# Patient Record
Sex: Female | Born: 1935 | Race: Black or African American | Hispanic: No | Smoking: Never smoker
Health system: Southern US, Community
[De-identification: ages and names within clinical notes are randomized; demographics above are authoritative.]

## PROBLEM LIST (undated history)

## (undated) DIAGNOSIS — R42 Dizziness and giddiness: Secondary | ICD-10-CM

## (undated) DIAGNOSIS — E119 Type 2 diabetes mellitus without complications: Secondary | ICD-10-CM

## (undated) DIAGNOSIS — E669 Obesity, unspecified: Secondary | ICD-10-CM

## (undated) DIAGNOSIS — T7840XA Allergy, unspecified, initial encounter: Secondary | ICD-10-CM

## (undated) DIAGNOSIS — I10 Essential (primary) hypertension: Secondary | ICD-10-CM

## (undated) HISTORY — DX: Dizziness and giddiness: R42

## (undated) HISTORY — PX: ABDOMINAL HYSTERECTOMY: SHX81

## (undated) HISTORY — DX: Type 2 diabetes mellitus without complications: E11.9

## (undated) HISTORY — DX: Allergy, unspecified, initial encounter: T78.40XA

## (undated) HISTORY — PX: TOTAL ABDOMINAL HYSTERECTOMY W/ BILATERAL SALPINGOOPHORECTOMY: SHX83

## (undated) HISTORY — DX: Essential (primary) hypertension: I10

## (undated) HISTORY — DX: Obesity, unspecified: E66.9

---

## 2000-10-22 ENCOUNTER — Encounter: Payer: Self-pay | Admitting: Family Medicine

## 2000-10-22 ENCOUNTER — Ambulatory Visit (HOSPITAL_COMMUNITY): Admission: RE | Admit: 2000-10-22 | Discharge: 2000-10-22 | Payer: Self-pay | Admitting: Family Medicine

## 2001-10-25 ENCOUNTER — Ambulatory Visit (HOSPITAL_COMMUNITY): Admission: RE | Admit: 2001-10-25 | Discharge: 2001-10-25 | Payer: Self-pay | Admitting: Internal Medicine

## 2001-10-25 ENCOUNTER — Encounter: Payer: Self-pay | Admitting: Internal Medicine

## 2003-03-27 ENCOUNTER — Ambulatory Visit (HOSPITAL_COMMUNITY): Admission: RE | Admit: 2003-03-27 | Discharge: 2003-03-27 | Payer: Self-pay | Admitting: Family Medicine

## 2004-01-11 ENCOUNTER — Ambulatory Visit (HOSPITAL_COMMUNITY): Admission: RE | Admit: 2004-01-11 | Discharge: 2004-01-11 | Payer: Self-pay | Admitting: Family Medicine

## 2004-01-15 ENCOUNTER — Ambulatory Visit (HOSPITAL_COMMUNITY): Admission: RE | Admit: 2004-01-15 | Discharge: 2004-01-15 | Payer: Self-pay | Admitting: Family Medicine

## 2004-03-29 ENCOUNTER — Ambulatory Visit (HOSPITAL_COMMUNITY): Admission: RE | Admit: 2004-03-29 | Discharge: 2004-03-29 | Payer: Self-pay | Admitting: Family Medicine

## 2004-05-09 ENCOUNTER — Ambulatory Visit: Payer: Self-pay | Admitting: Family Medicine

## 2004-05-10 ENCOUNTER — Ambulatory Visit (HOSPITAL_COMMUNITY): Admission: RE | Admit: 2004-05-10 | Discharge: 2004-05-10 | Payer: Self-pay | Admitting: Family Medicine

## 2004-05-25 ENCOUNTER — Ambulatory Visit: Payer: Self-pay | Admitting: Family Medicine

## 2004-07-04 ENCOUNTER — Ambulatory Visit: Payer: Self-pay | Admitting: Family Medicine

## 2004-10-04 ENCOUNTER — Ambulatory Visit: Payer: Self-pay | Admitting: Family Medicine

## 2004-10-13 ENCOUNTER — Ambulatory Visit: Payer: Self-pay | Admitting: *Deleted

## 2004-10-28 ENCOUNTER — Ambulatory Visit (HOSPITAL_COMMUNITY): Admission: RE | Admit: 2004-10-28 | Discharge: 2004-10-28 | Payer: Self-pay | Admitting: *Deleted

## 2004-10-28 ENCOUNTER — Ambulatory Visit: Payer: Self-pay | Admitting: Cardiovascular Disease

## 2004-12-02 ENCOUNTER — Ambulatory Visit: Payer: Self-pay | Admitting: *Deleted

## 2005-01-31 ENCOUNTER — Ambulatory Visit: Payer: Self-pay | Admitting: Family Medicine

## 2005-04-04 ENCOUNTER — Ambulatory Visit (HOSPITAL_COMMUNITY): Admission: RE | Admit: 2005-04-04 | Discharge: 2005-04-04 | Payer: Self-pay | Admitting: Family Medicine

## 2005-04-26 ENCOUNTER — Ambulatory Visit (HOSPITAL_COMMUNITY): Admission: RE | Admit: 2005-04-26 | Discharge: 2005-04-26 | Payer: Self-pay | Admitting: Family Medicine

## 2005-06-07 ENCOUNTER — Ambulatory Visit: Payer: Self-pay | Admitting: Family Medicine

## 2005-12-12 ENCOUNTER — Ambulatory Visit: Payer: Self-pay | Admitting: Family Medicine

## 2006-04-30 ENCOUNTER — Ambulatory Visit (HOSPITAL_COMMUNITY): Admission: RE | Admit: 2006-04-30 | Discharge: 2006-04-30 | Payer: Self-pay | Admitting: Family Medicine

## 2006-05-15 ENCOUNTER — Ambulatory Visit: Payer: Self-pay | Admitting: Family Medicine

## 2006-05-15 ENCOUNTER — Other Ambulatory Visit: Admission: RE | Admit: 2006-05-15 | Discharge: 2006-05-15 | Payer: Self-pay | Admitting: Family Medicine

## 2006-05-15 ENCOUNTER — Emergency Department (HOSPITAL_COMMUNITY): Admission: EM | Admit: 2006-05-15 | Discharge: 2006-05-15 | Payer: Self-pay | Admitting: Emergency Medicine

## 2006-09-26 ENCOUNTER — Encounter: Payer: Self-pay | Admitting: Family Medicine

## 2006-09-26 LAB — CONVERTED CEMR LAB
Basophils Absolute: 0 10*3/uL (ref 0.0–0.1)
Basophils Relative: 1 % (ref 0–1)
Calcium: 9.2 mg/dL (ref 8.4–10.5)
Cholesterol: 157 mg/dL (ref 0–200)
Eosinophils Absolute: 0.1 10*3/uL (ref 0.0–0.7)
Glucose, Bld: 91 mg/dL (ref 70–99)
HDL: 48 mg/dL (ref >39)
Lymphs Abs: 1.9 10*3/uL (ref 0.7–3.3)
MCV: 93.6 fL (ref 78.0–100.0)
Neutrophils Relative %: 54 % (ref 43–77)
Platelets: 163 10*3/uL (ref 150–400)
Potassium: 4.6 meq/L (ref 3.5–5.3)
RDW: 13.2 % (ref 11.5–14.0)
Sodium: 141 meq/L (ref 135–145)
Total CHOL/HDL Ratio: 3.3
Triglycerides: 122 mg/dL (ref <150)
VLDL: 24 mg/dL (ref 0–40)
WBC: 5.5 10*3/uL (ref 4.0–10.5)

## 2006-10-16 ENCOUNTER — Ambulatory Visit: Payer: Self-pay | Admitting: Family Medicine

## 2007-02-26 ENCOUNTER — Ambulatory Visit: Payer: Self-pay | Admitting: Family Medicine

## 2007-05-03 ENCOUNTER — Ambulatory Visit (HOSPITAL_COMMUNITY): Admission: RE | Admit: 2007-05-03 | Discharge: 2007-05-03 | Payer: Self-pay | Admitting: Family Medicine

## 2007-06-25 ENCOUNTER — Ambulatory Visit: Payer: Self-pay | Admitting: Family Medicine

## 2007-10-21 ENCOUNTER — Encounter: Payer: Self-pay | Admitting: Family Medicine

## 2007-10-21 DIAGNOSIS — E663 Overweight: Secondary | ICD-10-CM | POA: Insufficient documentation

## 2008-02-04 ENCOUNTER — Ambulatory Visit: Payer: Self-pay | Admitting: Family Medicine

## 2008-02-05 ENCOUNTER — Encounter: Payer: Self-pay | Admitting: Family Medicine

## 2008-02-12 ENCOUNTER — Encounter: Payer: Self-pay | Admitting: Family Medicine

## 2008-02-12 LAB — CONVERTED CEMR LAB
BUN: 23 mg/dL (ref 6–23)
Basophils Relative: 0 % (ref 0–1)
Chloride: 104 meq/L (ref 96–112)
Cholesterol: 159 mg/dL (ref 0–200)
Creatinine, Ser: 0.79 mg/dL (ref 0.40–1.20)
Eosinophils Absolute: 0.1 10*3/uL (ref 0.0–0.7)
Glucose, Bld: 100 mg/dL — ABNORMAL HIGH (ref 70–99)
HCT: 41.3 % (ref 36.0–46.0)
Hemoglobin: 13.6 g/dL (ref 12.0–15.0)
LDL Cholesterol: 75 mg/dL (ref 0–99)
Lymphs Abs: 2 10*3/uL (ref 0.7–4.0)
MCHC: 32.9 g/dL (ref 30.0–36.0)
MCV: 92.2 fL (ref 78.0–100.0)
Monocytes Absolute: 0.6 10*3/uL (ref 0.1–1.0)
Monocytes Relative: 10 % (ref 3–12)
Potassium: 4.2 meq/L (ref 3.5–5.3)
RBC: 4.48 M/uL (ref 3.87–5.11)
Triglycerides: 182 mg/dL — ABNORMAL HIGH (ref <150)
VLDL: 36 mg/dL (ref 0–40)
WBC: 5.7 10*3/uL (ref 4.0–10.5)

## 2008-03-03 ENCOUNTER — Ambulatory Visit: Payer: Self-pay | Admitting: Cardiology

## 2008-03-03 ENCOUNTER — Encounter: Payer: Self-pay | Admitting: Family Medicine

## 2008-03-09 ENCOUNTER — Ambulatory Visit: Payer: Self-pay | Admitting: Family Medicine

## 2008-03-09 DIAGNOSIS — J309 Allergic rhinitis, unspecified: Secondary | ICD-10-CM | POA: Insufficient documentation

## 2008-03-09 DIAGNOSIS — Z9189 Other specified personal risk factors, not elsewhere classified: Secondary | ICD-10-CM | POA: Insufficient documentation

## 2008-05-26 ENCOUNTER — Ambulatory Visit (HOSPITAL_COMMUNITY): Admission: RE | Admit: 2008-05-26 | Discharge: 2008-05-26 | Payer: Self-pay | Admitting: Family Medicine

## 2008-06-09 ENCOUNTER — Ambulatory Visit: Payer: Self-pay | Admitting: Family Medicine

## 2008-06-09 ENCOUNTER — Other Ambulatory Visit: Admission: RE | Admit: 2008-06-09 | Discharge: 2008-06-09 | Payer: Self-pay | Admitting: Family Medicine

## 2008-06-09 ENCOUNTER — Encounter: Payer: Self-pay | Admitting: Family Medicine

## 2008-06-09 LAB — CONVERTED CEMR LAB
Glucose, Bld: 106 mg/dL
Hgb A1c MFr Bld: 6.3 %
OCCULT 1: NEGATIVE

## 2008-09-02 ENCOUNTER — Encounter: Payer: Self-pay | Admitting: Family Medicine

## 2008-09-02 LAB — CONVERTED CEMR LAB
ALT: 27 units/L (ref 0–35)
AST: 25 units/L (ref 0–37)
Albumin: 4 g/dL (ref 3.5–5.2)
Alkaline Phosphatase: 85 units/L (ref 39–117)
BUN: 21 mg/dL (ref 6–23)
Chloride: 106 meq/L (ref 96–112)
Cholesterol: 154 mg/dL (ref 0–200)
Creatinine, Ser: 0.78 mg/dL (ref 0.40–1.20)
HDL: 51 mg/dL (ref >39)
Indirect Bilirubin: 0.4 mg/dL (ref 0.0–0.9)
Total Protein: 6.7 g/dL (ref 6.0–8.3)
Triglycerides: 112 mg/dL (ref <150)

## 2008-09-08 ENCOUNTER — Ambulatory Visit: Payer: Self-pay | Admitting: Family Medicine

## 2008-09-30 ENCOUNTER — Encounter: Payer: Self-pay | Admitting: Family Medicine

## 2008-09-30 ENCOUNTER — Ambulatory Visit (HOSPITAL_COMMUNITY): Admission: RE | Admit: 2008-09-30 | Discharge: 2008-09-30 | Payer: Self-pay | Admitting: Pediatrics

## 2008-12-09 ENCOUNTER — Ambulatory Visit: Payer: Self-pay | Admitting: Family Medicine

## 2008-12-17 ENCOUNTER — Encounter: Payer: Self-pay | Admitting: Family Medicine

## 2009-03-09 LAB — CONVERTED CEMR LAB
Basophils Absolute: 0 10*3/uL (ref 0.0–0.1)
Chloride: 103 meq/L (ref 96–112)
Creatinine, Urine: 107.6 mg/dL
Glucose, Bld: 110 mg/dL — ABNORMAL HIGH (ref 70–99)
HCT: 41.1 % (ref 36.0–46.0)
Hemoglobin: 13.4 g/dL (ref 12.0–15.0)
Lymphocytes Relative: 35 % (ref 12–46)
Lymphs Abs: 1.9 10*3/uL (ref 0.7–4.0)
Microalb Creat Ratio: 4.6 mg/g (ref 0.0–30.0)
Monocytes Absolute: 0.5 10*3/uL (ref 0.1–1.0)
Monocytes Relative: 10 % (ref 3–12)
Neutro Abs: 3 10*3/uL (ref 1.7–7.7)
Potassium: 4.2 meq/L (ref 3.5–5.3)
RBC: 4.4 M/uL (ref 3.87–5.11)
RDW: 13.5 % (ref 11.5–15.5)
Sodium: 142 meq/L (ref 135–145)
TSH: 1.898 microintl units/mL (ref 0.350–4.500)
WBC: 5.5 10*3/uL (ref 4.0–10.5)

## 2009-03-16 ENCOUNTER — Ambulatory Visit: Payer: Self-pay | Admitting: Family Medicine

## 2009-04-08 ENCOUNTER — Telehealth: Payer: Self-pay | Admitting: Family Medicine

## 2009-04-13 ENCOUNTER — Ambulatory Visit: Payer: Self-pay | Admitting: Family Medicine

## 2009-05-28 ENCOUNTER — Encounter: Payer: Self-pay | Admitting: Family Medicine

## 2009-07-09 ENCOUNTER — Ambulatory Visit (HOSPITAL_COMMUNITY): Admission: RE | Admit: 2009-07-09 | Discharge: 2009-07-09 | Payer: Self-pay | Admitting: Family Medicine

## 2009-07-12 ENCOUNTER — Encounter: Payer: Self-pay | Admitting: Family Medicine

## 2009-07-22 ENCOUNTER — Ambulatory Visit: Payer: Self-pay | Admitting: Family Medicine

## 2009-07-22 LAB — CONVERTED CEMR LAB
Blood Glucose, Fasting: 95 mg/dL
Hgb A1c MFr Bld: 5.9 % (ref 4.6–6.1)

## 2009-11-15 LAB — CONVERTED CEMR LAB
Calcium: 9.1 mg/dL (ref 8.4–10.5)
Chloride: 105 meq/L (ref 96–112)
Creatinine, Ser: 0.75 mg/dL (ref 0.40–1.20)
HDL: 49 mg/dL (ref >39)
Sodium: 141 meq/L (ref 135–145)
Triglycerides: 103 mg/dL (ref <150)

## 2009-11-22 ENCOUNTER — Ambulatory Visit: Payer: Self-pay | Admitting: Family Medicine

## 2009-11-22 DIAGNOSIS — R5383 Other fatigue: Secondary | ICD-10-CM

## 2009-11-22 DIAGNOSIS — R7303 Prediabetes: Secondary | ICD-10-CM

## 2009-11-22 DIAGNOSIS — R5381 Other malaise: Secondary | ICD-10-CM | POA: Insufficient documentation

## 2009-11-24 ENCOUNTER — Telehealth: Payer: Self-pay | Admitting: Family Medicine

## 2010-03-30 ENCOUNTER — Encounter: Payer: Self-pay | Admitting: Orthopedic Surgery

## 2010-03-30 ENCOUNTER — Telehealth: Payer: Self-pay | Admitting: Family Medicine

## 2010-03-30 ENCOUNTER — Ambulatory Visit (HOSPITAL_COMMUNITY): Admission: RE | Admit: 2010-03-30 | Discharge: 2010-03-30 | Payer: Self-pay | Admitting: Family Medicine

## 2010-03-30 LAB — CONVERTED CEMR LAB
Basophils Relative: 0 % (ref 0–1)
Eosinophils Absolute: 0.1 10*3/uL (ref 0.0–0.7)
Eosinophils Relative: 1 % (ref 0–5)
Hemoglobin: 13.8 g/dL (ref 12.0–15.0)
MCHC: 33.8 g/dL (ref 30.0–36.0)
MCV: 92.1 fL (ref 78.0–100.0)
Monocytes Absolute: 0.5 10*3/uL (ref 0.1–1.0)
Monocytes Relative: 8 % (ref 3–12)
RBC: 4.43 M/uL (ref 3.87–5.11)

## 2010-03-31 ENCOUNTER — Telehealth (INDEPENDENT_AMBULATORY_CARE_PROVIDER_SITE_OTHER): Payer: Self-pay | Admitting: *Deleted

## 2010-04-07 ENCOUNTER — Ambulatory Visit: Payer: Self-pay | Admitting: Family Medicine

## 2010-04-18 ENCOUNTER — Encounter: Payer: Self-pay | Admitting: Family Medicine

## 2010-04-21 ENCOUNTER — Ambulatory Visit: Payer: Self-pay | Admitting: Family Medicine

## 2010-04-21 ENCOUNTER — Telehealth: Payer: Self-pay | Admitting: Family Medicine

## 2010-05-10 ENCOUNTER — Encounter: Payer: Self-pay | Admitting: Family Medicine

## 2010-05-10 DIAGNOSIS — I10 Essential (primary) hypertension: Secondary | ICD-10-CM | POA: Insufficient documentation

## 2010-05-31 NOTE — Progress Notes (Signed)
Summary: results from x ray  Phone Note Call from Patient   Summary of Call: call her cell # @ [phone] when you get results from x ray Initial call taken by: Lind Guest,  March 30, 2010 10:54 AM  Follow-up for Phone Call        returned call, no answer Follow-up by: Adella Hare LPN,  March 30, 2010 5:57 PM

## 2010-05-31 NOTE — Progress Notes (Signed)
Summary: returning nurses call  Phone Note Call from Patient   Summary of Call: pt called back and said call her cell phone [phone] Initial call taken by: Rudene Anda,  November 24, 2009 8:08 AM  Follow-up for Phone Call        advised bloodwork normal Follow-up by: Adella Hare LPN,  November 24, 2009 9:02 AM

## 2010-05-31 NOTE — Miscellaneous (Signed)
Summary: med  Clinical Lists Changes  Medications: Rx of LOSARTAN POTASSIUM-HCTZ 50-12.5 MG TABS (LOSARTAN POTASSIUM-HCTZ) Take 1 tablet by mouth once a day;  #90 x 4;  Signed;  Entered by: Worthy Keeler LPN;  Authorized by: Syliva Overman MD;  Method used: Historical    Prescriptions: LOSARTAN POTASSIUM-HCTZ 50-12.5 MG TABS (LOSARTAN POTASSIUM-HCTZ) Take 1 tablet by mouth once a day  #90 x 4   Entered by:   Worthy Keeler LPN   Authorized by:   Syliva Overman MD   Signed by:   Worthy Keeler LPN on 04/54/0981   Method used:   Historical   RxID:   [id number]

## 2010-05-31 NOTE — Progress Notes (Signed)
Summary: returning call  Phone Note Call from Patient   Summary of Call: patient called stated that someone called her yesterday..patient said to call her on her cell # [phone] Initial call taken by: Eugenio Hoes,  March 31, 2010 8:12 AM  Follow-up for Phone Call        patient called in and said someone called her yesterday.  I looked and saw that you tried calling her about her imagings, I advised the patient that no fractures were seen, and patient was a little upset states that she has paperwork she needs filled out and will bring them in next week.  She said that she didn't think the insurance was going to pay since there was no fracture.  She also stated it was hard for her to lift her arm.  I told her I would put a phone note in.  Please advise. Follow-up by: Curtis Sites,  March 31, 2010 9:38 AM  Additional Follow-up for Phone Call Additional follow up Details #1::        I will adress her concerns at next visit, pls let her know Additional Follow-up by: Syliva Overman MD,  March 31, 2010 5:16 PM    Additional Follow-up for Phone Call Additional follow up Details #2::    left msg for patient to return my call. Follow-up by: Curtis Sites,  April 01, 2010 10:13 AM  Additional Follow-up for Phone Call Additional follow up Details #3:: Details for Additional Follow-up Action Taken: called patient, no answer Adella Hare LPN,  April 01, 2010 4:04 PM  Patient called back and I gave her the message that her concerns would be addressed at her next office meeting, she understood. Additional Follow-up by: Curtis Sites,  April 04, 2010 1:58 PM

## 2010-05-31 NOTE — Progress Notes (Signed)
Summary: eye exam  eye exam   Imported By: Lind Guest 07/23/2009 13:58:39  _____________________________________________________________________  External Attachment:    Type:   Image     Comment:   External Document

## 2010-05-31 NOTE — Assessment & Plan Note (Signed)
Summary: office visit   Vital Signs:  Patient profile:   75 year old female Menstrual status:  hysterectomy Height:      67 inches Weight:      198.50 pounds BMI:     31.20 O2 Sat:      95 % Pulse rate:   65 / minute Pulse rhythm:   regular Resp:     16 per minute BP sitting:   118 / 68  (left arm) Cuff size:   large  Vitals Entered By: Everitt Amber LPN (July 22, 2009 7:59 AM)  Nutrition Counseling: Patient's BMI is greater than 25 and therefore counseled on weight management options. CC: Follow up chronic problems   CC:  Follow up chronic problems.  History of Present Illness: Reports  that she has been  doing well. Denies recent fever or chills. Denies sinus pressure, nasal congestion , ear pain or sore throat. Denies chest congestion, or cough productive of sputum. Denies chest pain, palpitations, PND, orthopnea or leg swelling. Denies abdominal pain, nausea, vomitting, diarrhea or constipation. Denies change in bowel movements or bloody stool. Denies dysuria , frequency, incontinence or hesitancy. Denies  joint pain, swelling, or reduced mobility. Denies headaches, vertigo, seizures. Denies depression, anxiety or insomnia. Denies  rash, lesions, or itch. She continues to exercise at least 4 days per week and has also changed her diet.She has been very successful with weight loss.     Current Medications (verified): 1)  Anacin 81 Mg  Tbec (Aspirin) .... One Tab By Mouth Once Daily 2)  Centrum Silver   Tabs (Multiple Vitamins-Minerals) .... One Tab By Mouth Once Daily 3)  Calcium 600/vitamin D 600-400 Mg-Unit Chew (Calcium Carbonate-Vitamin D) .... Take 1 Tablet By Mouth Two Times A Day 4)  Losartan Potassium-Hctz 50-12.5 Mg Tabs (Losartan Potassium-Hctz) .... Take 1 Tablet By Mouth Once A Day  Allergies (verified): 1)  ! * Lotensin Hct  Review of Systems      See HPI Eyes:  Denies blurring and discharge. Derm:  Denies itching, lesion(s), and rash. Endo:   Denies cold intolerance, excessive hunger, excessive thirst, excessive urination, heat intolerance, polyuria, and weight change. Heme:  not testing regulalrly. Allergy:  Denies hives or rash and itching eyes.  Physical Exam  General:  Well-developed,well-nourished,in no acute distress; alert,appropriate and cooperative throughout examination HEENT: No facial asymmetry,  EOMI, No sinus tenderness, TM's Clear, oropharynx  pink and moist.   Chest: Clear to auscultation bilaterally.  CVS: S1, S2, No murmurs, No S3.   Abd: Soft, Nontender.  MS: Adequate ROM spine, hips, shoulders and knees.  Ext: No edema.   CNS: CN 2-12 intact, power tone and sensation normal throughout.   Skin: Intact, no visible lesions or rashes.  Psych: Good eye contact, normal affect.  Memory intact, not anxious or depressed appearing.    Impression & Recommendations:  Problem # 1:  ALLERGIC RHINITIS CAUSE UNSPECIFIED (ICD-477.9) Assessment Deteriorated  Her updated medication list for this problem includes:    Zyrtec Hives Relief 10 Mg Tabs (Cetirizine hcl) .Marland Kitchen... Take 1 tablet by mouth once a day as needed  Problem # 2:  OBESITY, MILD (ICD-278.02) Assessment: Improved  Ht: 67 (07/22/2009)   Wt: 198.50 (07/22/2009)   BMI: 31.20 (07/22/2009)  Problem # 3:  DIABETES MELLITUS, TYPE II (ICD-250.00) Assessment: Improved  Her updated medication list for this problem includes:    Anacin 81 Mg Tbec (Aspirin) ..... One tab by mouth once daily    Losartan Potassium-hctz 50-12.5  Mg Tabs (Losartan potassium-hctz) .Marland Kitchen... Take 1 tablet by mouth once a day  Orders: Glucose, (CBG) (82962) T- Hemoglobin A1C ([id number])  Labs Reviewed: Creat: 0.78 (03/09/2009)     Last Eye Exam: normal (08/06/2008) Reviewed HgBA1c results: 5.8 (03/16/2009)  6.1 (12/09/2008)  Problem # 4:  HYPERTENSION (ICD-401.9) Assessment: Unchanged  Her updated medication list for this problem includes:    Losartan Potassium-hctz 50-12.5 Mg  Tabs (Losartan potassium-hctz) .Marland Kitchen... Take 1 tablet by mouth once a day  Orders: T-Basic Metabolic Panel ([id number])  BP today: 118/68 Prior BP: 120/70 (04/13/2009)  Labs Reviewed: K+: 4.2 (03/09/2009) Creat: : 0.78 (03/09/2009)   Chol: 154 (09/02/2008)   HDL: 51 (09/02/2008)   LDL: 81 (09/02/2008)   TG: 112 (09/02/2008)  Complete Medication List: 1)  Anacin 81 Mg Tbec (Aspirin) .... One tab by mouth once daily 2)  Centrum Silver Tabs (Multiple vitamins-minerals) .... One tab by mouth once daily 3)  Calcium 600/vitamin D 600-400 Mg-unit Chew (Calcium carbonate-vitamin d) .... Take 1 tablet by mouth two times a day 4)  Losartan Potassium-hctz 50-12.5 Mg Tabs (Losartan potassium-hctz) .... Take 1 tablet by mouth once a day 5)  Zyrtec Hives Relief 10 Mg Tabs (Cetirizine hcl) .... Take 1 tablet by mouth once a day as needed  Other Orders: T-Lipid Profile ([id number])  Patient Instructions: 1)  CPE in 4 months. 2)  Congrats on your healthy lifestyle wth weight loss, pls leep it up. 3)  Lab today and fasting lipid and chem 7 in 4 months. Prescriptions: ZYRTEC HIVES RELIEF 10 MG TABS (CETIRIZINE HCL) Take 1 tablet by mouth once a day as needed  #30 x 2   Entered and Authorized by:   Syliva Overman MD   Signed by:   Syliva Overman MD on 07/22/2009   Method used:   Electronically to        [facility] (retail)       [address]       [place], [place]  [address]       Ph: [phone]       Fax: [phone]   RxID:   [id number]   Laboratory Results   Blood Tests     Glucose (fasting): 95 mg/dL   (Normal Range: 15-176)
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
## 2010-06-02 NOTE — Progress Notes (Signed)
Summary: LEG PROBLEMS  Phone Note Call from Patient   Summary of Call: PATIENT CAMES IN STATES THAT SHE IS HAVING PROBLEMS OUT OF HER RIGHT LEG ADVISE PATIENT DID NOT HAVE ANYTHING TODAY... SHE WANTED TO KNOW COULD THE DOCTOR CALL SOME MEDICATION FOR HER PLEASE CALL @ [phone] Initial call taken by: Eugenio Hoes,  April 21, 2010 12:58 PM  Follow-up for Phone Call        pls let her know med is sent to her pharmacy Follow-up by: Syliva Overman MD,  April 21, 2010 1:53 PM  Additional Follow-up for Phone Call Additional follow up Details #1::        patient aware Additional Follow-up by: Adella Hare LPN,  April 21, 2010 2:22 PM    New/Updated Medications: IBUPROFEN 600 MG TABS (IBUPROFEN) Take 1 tablet by mouth three times a day for 5 days , then once daily as needed for pain Prescriptions: IBUPROFEN 600 MG TABS (IBUPROFEN) Take 1 tablet by mouth three times a day for 5 days , then once daily as needed for pain  #20 x 0   Entered and Authorized by:   Syliva Overman MD   Signed by:   Syliva Overman MD on 04/21/2010   Method used:   Electronically to        [facility] (retail)       [address]       [place], [place]  [address]       Ph: [phone]       Fax: [phone]   RxID:   [id number]

## 2010-06-02 NOTE — Assessment & Plan Note (Signed)
Summary: INS CARD   Allergies: 1)  ! * Lotensin Hct   Complete Medication List: 1)  Anacin 81 Mg Tbec (Aspirin) .... One tab by mouth once daily 2)  Centrum Silver Tabs (Multiple vitamins-minerals) .... One tab by mouth once daily 3)  Calcium 600/vitamin D 600-400 Mg-unit Chew (Calcium carbonate-vitamin d) .... Take 1 tablet by mouth two times a day 4)  Losartan Potassium-hctz 50-12.5 Mg Tabs (Losartan potassium-hctz) .... Take 1 tablet by mouth once a day 5)  Zyrtec Hives Relief 10 Mg Tabs (Cetirizine hcl) .... Take 1 tablet by mouth once a day as needed  Other Orders: Form Completion ([address])   Orders Added: 1)  Form Completion [[id number]]

## 2010-06-02 NOTE — Miscellaneous (Signed)
Summary: correction on billing for form completion for accidental fall   Clinical Lists Changes  Problems: Added new problem of ESSENTIAL HYPERTENSION (ICD-401.9) Orders: Added new Service order of Form Completion [phone]) - Signed

## 2010-06-02 NOTE — Letter (Signed)
Summary: ACCIDENT PAPER  ACCIDENT PAPER   Imported By: Lind Guest 04/18/2010 10:10:53  _____________________________________________________________________  External Attachment:    Type:   Image     Comment:   External Document

## 2010-06-02 NOTE — Assessment & Plan Note (Signed)
Summary: office visit   Vital Signs:  Patient profile:   75 year old female Menstrual status:  hysterectomy Height:      67 inches Weight:      204.25 pounds BMI:     32.11 O2 Sat:      98 % on Room air Pulse rate:   67 / minute Pulse rhythm:   regular Resp:     16 per minute BP sitting:   130 / 76  (left arm)  Vitals Entered By: Adella Hare LPN (April 07, 2010 10:29 AM)  Nutrition Counseling: Patient's BMI is greater than 25 and therefore counseled on weight management options.  O2 Flow:  Room air CC: follow-up visit Is Patient Diabetic? No Pain Assessment Patient in pain? no        CC:  follow-up visit.  History of Present Illness: [facility], she was volunteering to make soup, think it was 03/11/2010. Had pain over right arm and right leg, xrays are neg, still hurts but not as severe. she has not been exercising as before she has been eating more sweets and gaine d weight, fortunately her hBA1c  is better Reports  that she has otherwise been doing well. Denies recent fever or chills. Denies sinus pressure, nasal congestion , ear pain or sore throat. Denies chest congestion, or cough productive of sputum. Denies chest pain, palpitations, PND, orthopnea or leg swelling. Denies abdominal pain, nausea, vomitting, diarrhea or constipation. Denies change in bowel movements or bloody stool. Denies dysuria , frequency, incontinence or hesitancy.  Denies headaches, vertigo, seizures. Denies depression, anxiety or insomnia. Denies  rash, lesions, or itch.     Current Medications (verified): 1)  Anacin 81 Mg  Tbec (Aspirin) .... One Tab By Mouth Once Daily 2)  Centrum Silver   Tabs (Multiple Vitamins-Minerals) .... One Tab By Mouth Once Daily 3)  Calcium 600/vitamin D 600-400 Mg-Unit Chew (Calcium Carbonate-Vitamin D) .... Take 1 Tablet By Mouth Two Times A Day 4)  Losartan Potassium-Hctz 50-12.5 Mg Tabs (Losartan  Potassium-Hctz) .... Take 1 Tablet By Mouth Once A Day 5)  Zyrtec Hives Relief 10 Mg Tabs (Cetirizine Hcl) .... Take 1 Tablet By Mouth Once A Day As Needed  Allergies (verified): 1)  ! * Lotensin Hct  Social History: Reviewed history from 10/21/2007 and no changes required. Retired Widow 0 children Never Smoked Alcohol use-no Drug use-no  Review of Systems      See HPI Eyes:  Denies blurring and discharge. MS:  Complains of joint pain; right arm and leg pai improved since recen trauma. Endo:  Denies cold intolerance, excessive hunger, excessive thirst, and excessive urination. Heme:  Denies abnormal bruising and bleeding. Allergy:  Denies hives or rash and itching eyes.  Physical Exam  General:  Well-developed,well-nourished,in no acute distress; alert,appropriate and cooperative throughout examination HEENT: No facial asymmetry,  EOMI, No sinus tenderness, TM's Clear, oropharynx  pink and moist.   Chest: Clear to auscultation bilaterally.  CVS: S1, S2, No murmurs, No S3.   Abd: Soft, Nontender.  MS: Adequate ROM spine, hips, shoulders and knees. mild tenderness on palpation of right arm and leg, no swelling Ext: No edema.   CNS: CN 2-12 intact, power tone and sensation normal throughout.   Skin: Intact, no visible lesions or rashes.No bruises, or skin breakdown Psych: Good eye contact, normal affect.  Memory intact, not anxious or depressed appearing.   Diabetes Management Exam:  Eye Exam:       Eye Exam done elsewhere          Date: 02/11/2010          Results: normal          Done by: dr patty   Impression & Recommendations:  Problem # 1:  HYPERTENSION (ICD-401.9) Assessment Improved  Her updated medication list for this problem includes:    Losartan Potassium-hctz 50-12.5 Mg Tabs (Losartan potassium-hctz) .Marland Kitchen... Take 1 tablet by mouth once a day  BP today: 130/76 Prior BP: 138/76 (11/22/2009)  Labs Reviewed: K+: 4.0 (11/15/2009) Creat: : 0.75  (11/15/2009)   Chol: 173 (11/15/2009)   HDL: 49 (11/15/2009)   LDL: 103 (11/15/2009)   TG: 103 (11/15/2009)  Problem # 2:  IMPAIRED FASTING GLUCOSE (ICD-790.21) Assessment: Improved hBA1c improved  Problem # 3:  OBESITY, MILD (ICD-278.02) Assessment: Comment Only  Ht: 67 (04/07/2010)   Wt: 204.25 (04/07/2010)   BMI: 32.11 (04/07/2010) therapeutic lifestyle change discussed and encouraged  Problem # 4:  ACCIDENTAL FALL FROM OTHER FURNITURE (ICD-E884.5) Assessment: Improved improved symptoms of arm and leg pain following tripon rug  Complete Medication List: 1)  Anacin 81 Mg Tbec (Aspirin) .... One tab by mouth once daily 2)  Centrum Silver Tabs (Multiple vitamins-minerals) .... One tab by mouth once daily 3)  Calcium 600/vitamin D 600-400 Mg-unit Chew (Calcium carbonate-vitamin d) .... Take 1 tablet by mouth two times a day 4)  Losartan Potassium-hctz 50-12.5 Mg Tabs (Losartan potassium-hctz) .... Take 1 tablet by mouth once a day 5)  Zyrtec Hives Relief 10 Mg Tabs (Cetirizine hcl) .... Take 1 tablet by mouth once a day as needed  Other Orders: T- Hemoglobin A1C ([id number])  Patient Instructions: 1)  CPE in 4 months. 2)  It is important that you exercise regularly at least 30 minutes 5 times a week. If you develop chest pain, have severe difficulty breathing, or feel very tired , stop exercising immediately and seek medical attention. 3)  You need to lose weight. Consider a lower calorie diet and regular exercise. Goal is 5 pounds 4)  HbgA1C prior to visit, ICD-9 5)  no med changes at this time. 6)  Happy christmas.:   Orders Added: 1)  Est. Patient Level IV [99214] 2)  T- Hemoglobin A1C [[id number]]   Immunization History:  Influenza Immunization History:    Influenza:  historical (01/29/2009)    Influenza:  historical (01/29/2010) 01/29/09 entered in error  Immunization History:  Influenza Immunization History:    Influenza:  Historical (01/29/2009)     Influenza:  Historical (01/29/2010)

## 2010-06-09 ENCOUNTER — Ambulatory Visit (INDEPENDENT_AMBULATORY_CARE_PROVIDER_SITE_OTHER): Payer: Medicare Other | Admitting: Family Medicine

## 2010-06-09 ENCOUNTER — Encounter: Payer: Self-pay | Admitting: Family Medicine

## 2010-06-09 DIAGNOSIS — M79609 Pain in unspecified limb: Secondary | ICD-10-CM | POA: Insufficient documentation

## 2010-06-09 DIAGNOSIS — E663 Overweight: Secondary | ICD-10-CM

## 2010-06-16 NOTE — Assessment & Plan Note (Signed)
Summary: office visit   Vital Signs:  Patient profile:   75 year old female Menstrual status:  hysterectomy Height:      67 inches Weight:      209.25 pounds BMI:     32.89 O2 Sat:      96 % Pulse rate:   78 / minute Resp:     16 per minute BP sitting:   130 / 70  (left arm) Cuff size:   large  Vitals Entered By: Everitt Amber LPN (June 09, 2010 9:49 AM) CC: Follow up, she fell the 12th of Nov and had xrays done but she is still having pain in her right lower leg. Doesn't hurt at night but starts hurting in the mornings when she gets up Pain Assessment Patient in pain? yes     Location: right leg Intensity: 8 Type: aching Onset of pain  after fall in Nov, not getting better   CC:  Follow up and she fell the 12th of Nov and had xrays done but she is still having pain in her right lower leg. Doesn't hurt at night but starts hurting in the mornings when she gets up.  History of Present Illness: c/o pain in right leg radiating up to right buttock evere since a fall Nov 12. Pt states no pain at night, but weight bearing or walking causes pain from an 8 to a 10. She still also is c/o right arm pain.  Reports  that she is otherwise doing well. she is concerned about continued weight gain , espescially becus of her inability to exercise Denies recent fever or chills. Denies sinus pressure, nasal congestion , ear pain or sore throat. Denies chest congestion, or cough productive of sputum. Denies chest pain, palpitations, PND, orthopnea or leg swelling. Denies abdominal pain, nausea, vomitting, diarrhea or constipation. Denies change in bowel movements or bloody stool. Denies dysuria , frequency, incontinence or hesitancy.  Denies headaches, vertigo, seizures. Denies depression, anxiety or insomnia. Denies  rash, lesions, or itch.      Current Medications (verified): 1)  Anacin 81 Mg  Tbec (Aspirin) .... One Tab By Mouth Once Daily 2)  Centrum Silver   Tabs (Multiple  Vitamins-Minerals) .... One Tab By Mouth Once Daily 3)  Calcium 600/vitamin D 600-400 Mg-Unit Chew (Calcium Carbonate-Vitamin D) .... Take 1 Tablet By Mouth Two Times A Day 4)  Losartan Potassium-Hctz 50-12.5 Mg Tabs (Losartan Potassium-Hctz) .... Take 1 Tablet By Mouth Once A Day 5)  Zyrtec Hives Relief 10 Mg Tabs (Cetirizine Hcl) .... Take 1 Tablet By Mouth Once A Day As Needed 6)  Ibuprofen 600 Mg Tabs (Ibuprofen) .... Take 1 Tablet By Mouth Three Times A Day For 5 Days , Then Once Daily As Needed For Pain  Allergies (verified): 1)  ! * Lotensin Hct  Review of Systems      See HPI Eyes:  Complains of vision loss-both eyes. MS:  Complains of joint pain, low back pain, mid back pain, muscle weakness, and stiffness. Endo:  Complains of weight change; denies cold intolerance, excessive hunger, excessive thirst, and excessive urination. Heme:  Denies abnormal bruising, bleeding, enlarge lymph nodes, and fevers. Allergy:  Denies hives or rash, itching eyes, persistent infections, and seasonal allergies.  Physical Exam  General:  Well-developed,well-nourished,in no acute distress; alert,appropriate and cooperative throughout examination HEENT: No facial asymmetry,  EOMI, No sinus tenderness, TM's Clear, oropharynx  pink and moist.   Chest: Clear to auscultation bilaterally.  CVS: S1, S2, No  murmurs, No S3.   Abd: Soft, Nontender.  MS: decreased  ROM spine and right  hip,adequate in  shoulder and knees. mild tenderness on palpation of right arm and leg, no swelling Ext: No edema.   CNS: CN 2-12 intact, power tone and sensation normal throughout.   Skin: Intact, no visible lesions or rashes.No bruises, or skin breakdown Psych: Good eye contact, normal affect.  Memory intact, not anxious or depressed appearing.    Impression & Recommendations:  Problem # 1:  ARM PAIN, RIGHT (ICD-729.5) Assessment Unchanged  Orders: Orthopedic Referral (Ortho) Admin of Therapeutic Inj  intramuscular  or subcutaneous (11914)  Problem # 2:  LEG PAIN, RIGHT (ICD-729.5) Assessment: Unchanged  Orders: Medicare Electronic Prescription (N8295) Depo- Medrol 80mg  (J1040) Orthopedic Referral (Ortho) Ketorolac-Toradol 15mg  (A2130)  Problem # 3:  ESSENTIAL HYPERTENSION (ICD-401.9) Assessment: Unchanged  Her updated medication list for this problem includes:    Losartan Potassium-hctz 50-12.5 Mg Tabs (Losartan potassium-hctz) .Marland Kitchen... Take 1 tablet by mouth once a day  BP today: 130/70 Prior BP: 130/76 (04/07/2010)  Labs Reviewed: K+: 4.0 (11/15/2009) Creat: : 0.75 (11/15/2009)   Chol: 173 (11/15/2009)   HDL: 49 (11/15/2009)   LDL: 103 (11/15/2009)   TG: 103 (11/15/2009)  Problem # 4:  FATIGUE (ICD-780.79) Assessment: Deteriorated weight gain and inactivity are the main contributors  Problem # 5:  OBESITY, MILD (ICD-278.02) Assessment: Deteriorated  Ht: 67 (06/09/2010)   Wt: 209.25 (06/09/2010)   BMI: 32.89 (06/09/2010) therapeutic lifestyle change discussed and encouraged  Complete Medication List: 1)  Anacin 81 Mg Tbec (Aspirin) .... One tab by mouth once daily 2)  Centrum Silver Tabs (Multiple vitamins-minerals) .... One tab by mouth once daily 3)  Calcium 600/vitamin D 600-400 Mg-unit Chew (Calcium carbonate-vitamin d) .... Take 1 tablet by mouth two times a day 4)  Losartan Potassium-hctz 50-12.5 Mg Tabs (Losartan potassium-hctz) .... Take 1 tablet by mouth once a day 5)  Zyrtec Hives Relief 10 Mg Tabs (Cetirizine hcl) .... Take 1 tablet by mouth once a day as needed 6)  Prednisone (pak) 5 Mg Tabs (Prednisone) .... Use as directed 7)  Ibuprofen 800 Mg Tabs (Ibuprofen) .... Take 1 tablet by mouth three times a day  Patient Instructions: 1)  F/U as before. 2)  You wil get injections today and meds are also sent in. 3)  You are being referred to orthopedics as soon as possible re your lower extremity and upper arm pain. Prescriptions: IBUPROFEN 800 MG TABS (IBUPROFEN) Take 1  tablet by mouth three times a day  #30 x 0   Entered and Authorized by:   Syliva Overman MD   Signed by:   Syliva Overman MD on 06/09/2010   Method used:   Electronically to        [facility], [facility] (retail)       [address]       [place], [place]  [address]       Ph: [phone]       Fax: [phone]   RxID:   [id number] PREDNISONE (PAK) 5 MG TABS (PREDNISONE) Use as directed  #21 x 0   Entered and Authorized by:   Syliva Overman MD   Signed by:   Syliva Overman MD on 06/09/2010   Method used:   Electronically to        [facility], [facility] (retail)       [address]  [place], [place]  [address]       Ph: [phone]       Fax: [phone]   RxID:   [id number]    Medication Administration  Injection # 1:    Medication: Depo- Medrol 80mg     Diagnosis: LEG PAIN, RIGHT (ICD-729.5)    Route: IM    Site: RUOQ gluteus    Exp Date: 07/12    Lot #: Gunnar [id number]    Mfr: Pharmacia    Patient tolerated injection without complications    Given by: Adella Hare LPN (June 09, 2010 10:25 AM)  Injection # 2:    Medication: Ketorolac-Toradol 15mg     Diagnosis: LEG PAIN, RIGHT (ICD-729.5)    Route: IM    Site: LUOQ gluteus    Exp Date: 09/30/2011    Lot #: [id number]    Mfr: [email or web address]    Comments: toradol 60mg  given    Patient tolerated injection without complications    Given by: Adella Hare LPN (June 09, 2010 10:25 AM)  Orders Added: 1)  Est. Patient Level IV [59563] 2)  Medicare Electronic Prescription [G8553] 3)  Depo- Medrol 80mg  [J1040] 4)  Orthopedic Referral [Ortho] 5)  Ketorolac-Toradol 15mg  [J1885] 6)  Admin of Therapeutic Inj  intramuscular or subcutaneous [96372]     Medication Administration  Injection # 1:    Medication: Depo- Medrol 80mg     Diagnosis: LEG PAIN, RIGHT (ICD-729.5)    Route: IM    Site: RUOQ gluteus    Exp Date: 07/12    Lot #: Gunnar [id number]    Mfr: Pharmacia     Patient tolerated injection without complications    Given by: Adella Hare LPN (June 09, 2010 10:25 AM)  Injection # 2:    Medication: Ketorolac-Toradol 15mg     Diagnosis: LEG PAIN, RIGHT (ICD-729.5)    Route: IM    Site: LUOQ gluteus    Exp Date: 09/30/2011    Lot #: [id number]    Mfr: [email or web address]    Comments: toradol 60mg  given    Patient tolerated injection without complications    Given by: Adella Hare LPN (June 09, 2010 10:25 AM)  Orders Added: 1)  Est. Patient Level IV [29518] 2)  Medicare Electronic Prescription [G8553] 3)  Depo- Medrol 80mg  [J1040] 4)  Orthopedic Referral [Ortho] 5)  Ketorolac-Toradol 15mg  [J1885] 6)  Admin of Therapeutic Inj  intramuscular or subcutaneous [84166]

## 2010-07-08 ENCOUNTER — Other Ambulatory Visit: Payer: Self-pay | Admitting: Family Medicine

## 2010-07-08 DIAGNOSIS — Z139 Encounter for screening, unspecified: Secondary | ICD-10-CM

## 2010-07-14 ENCOUNTER — Ambulatory Visit (INDEPENDENT_AMBULATORY_CARE_PROVIDER_SITE_OTHER): Payer: Medicare Other | Admitting: Orthopedic Surgery

## 2010-07-14 ENCOUNTER — Encounter: Payer: Self-pay | Admitting: Orthopedic Surgery

## 2010-07-14 DIAGNOSIS — G5780 Other specified mononeuropathies of unspecified lower limb: Secondary | ICD-10-CM

## 2010-07-19 NOTE — Assessment & Plan Note (Signed)
Summary: RT LEG,"MID-PART,BACK OF LEG"/XR's [employer]/MEDICARE,BCBS/CAF   Visit Type:  new patient Referring Provider:  Dr. Syliva Overman  CC:  right leg pain.  History of Present Illness: I saw Erika [name] in the office today for an initial visit.  She is a 75 years old woman with the complaint of:  right leg pain  Medications: Calcium 600 mg, Aspirin 81 mg, Multivitamin, Losartan/HETZ.  The patient fell several weeks ago, and since that time has had a pain on the outer portion of her RIGHT leg on the lateral aspect of the L5 and peroneal nerve district. Pain is 8/10. She describes it as burning, it is also  associated with numbness and bad weather.   Family history of heart disease, arthritis, diabetes.  Social history she is widowed retired. Does not smoke or drink caffeine discrete complete. It was 12     Allergies: 1)  ! * Lotensin Hct  Review of Systems Constitutional:  Complains of weight gain; denies weight loss, fever, chills, and fatigue. Cardiovascular:  Denies chest pain, palpitations, fainting, and murmurs. Respiratory:  Denies short of breath, wheezing, couch, tightness, pain on inspiration, and snoring . Gastrointestinal:  Denies heartburn, nausea, vomiting, diarrhea, constipation, and blood in your stools. Genitourinary:  Complains of bleeding in urine; denies frequency, urgency, difficulty urinating, painful urination, and flank pain. Neurologic:  Complains of dizziness; denies numbness, tingling, unsteady gait, tremors, and seizure. Musculoskeletal:  Complains of muscle pain; denies joint pain, swelling, instability, stiffness, redness, and heat. Endocrine:  Denies excessive thirst, exessive urination, and heat or cold intolerance. Psychiatric:  Denies nervousness, depression, anxiety, and hallucinations. Skin:  Denies changes in the skin, poor healing, rash, itching, and redness. HEENT:  Denies blurred or double vision, eye pain, redness, and  watering. Immunology:  Denies seasonal allergies, sinus problems, and allergic to bee stings. Hemoatologic:  Complains of easy bleeding; denies brusing.  Physical Exam  Msk:  normal grooming hygiene. medium - large for him.   Height 5 7-1/2, weight 208 normal resting pulse of 76.   Pulses:  normal RIGHT lower extremity dorsalis pedis, posterior tibial pulses, normal color. No peripheral edema Extremities:  normal strength in the RIGHT foot  Soft tissue tenderness along the lateral compartment. Neurologic:  tenderness around the peroneal nerve at the fibula tenderness down the RIGHT leg as well. Skin:  intact without lesions or rashes Psych:  alert and cooperative; normal mood and affect; normal attention span and concentration   Impression & Recommendations:  Problem # 1:  OTHER MONONEURITIS OF LOWER LIMB (ICD-355.79) Assessment Comment Only The x-rays were done at [facility]. The report and the films have been reviewed. RIGHT tib-fib x-ray report, negative  peroneal nerve neuropathy, post traumatic.  Recommend Neurontin for 4-8 weeks.  Orders: New Patient Level II ([address])  Medications Added to Medication List This Visit: 1)  Neurontin 100 Mg Caps (Gabapentin) .Marland Kitchen.. 1 tablet three times a day  or all 3 tabs at night  Patient Instructions: 1)  take medication until it runs out  2)  no follow up needed  Prescriptions: NEURONTIN 100 MG CAPS (GABAPENTIN) 1 tablet three times a day  or all 3 tabs at night  #60 x 1   Entered and Authorized by:   Fuller Canada MD   Signed by:   Fuller Canada MD on 07/14/2010   Method used:   Print then Give to Patient   RxID:   [id number]    Orders Added: 1)  New Patient Level II [95621]

## 2010-07-21 ENCOUNTER — Other Ambulatory Visit: Payer: Self-pay | Admitting: Family Medicine

## 2010-07-22 ENCOUNTER — Ambulatory Visit (HOSPITAL_COMMUNITY): Payer: Medicare Other

## 2010-07-25 ENCOUNTER — Other Ambulatory Visit: Payer: Self-pay | Admitting: Family Medicine

## 2010-07-25 ENCOUNTER — Ambulatory Visit (HOSPITAL_COMMUNITY)
Admission: RE | Admit: 2010-07-25 | Discharge: 2010-07-25 | Disposition: A | Discharge status: Home / Self Care | Payer: Medicare Other | Source: Ambulatory Visit | Attending: Family Medicine | Admitting: Family Medicine

## 2010-07-25 DIAGNOSIS — Z1231 Encounter for screening mammogram for malignant neoplasm of breast: Secondary | ICD-10-CM | POA: Insufficient documentation

## 2010-07-25 DIAGNOSIS — Z139 Encounter for screening, unspecified: Secondary | ICD-10-CM

## 2010-07-26 LAB — HEMOGLOBIN A1C
Hgb A1c MFr Bld: 5.9 % — ABNORMAL HIGH (ref <5.7)
Mean Plasma Glucose: 123 mg/dL — ABNORMAL HIGH (ref <117)

## 2010-07-28 NOTE — Letter (Signed)
Summary: History form  History form   Imported By: Cammie Sickle 07/18/2010 21:29:15  _____________________________________________________________________  External Attachment:    Type:   Image     Comment:   External Document

## 2010-08-01 ENCOUNTER — Encounter: Payer: Self-pay | Admitting: Family Medicine

## 2010-08-03 ENCOUNTER — Encounter: Payer: Self-pay | Admitting: Family Medicine

## 2010-08-03 ENCOUNTER — Ambulatory Visit (INDEPENDENT_AMBULATORY_CARE_PROVIDER_SITE_OTHER): Payer: Medicare Other | Admitting: Family Medicine

## 2010-08-03 ENCOUNTER — Other Ambulatory Visit (HOSPITAL_COMMUNITY)
Admission: RE | Admit: 2010-08-03 | Discharge: 2010-08-03 | Disposition: A | Discharge status: Home / Self Care | Payer: Medicare Other | Source: Ambulatory Visit | Attending: Family Medicine | Admitting: Family Medicine

## 2010-08-03 VITALS — BP 140/80 | HR 68 | Resp 16 | Ht 68.0 in | Wt 207.1 lb

## 2010-08-03 DIAGNOSIS — Z13220 Encounter for screening for lipoid disorders: Secondary | ICD-10-CM

## 2010-08-03 DIAGNOSIS — I10 Essential (primary) hypertension: Secondary | ICD-10-CM

## 2010-08-03 DIAGNOSIS — Z124 Encounter for screening for malignant neoplasm of cervix: Secondary | ICD-10-CM

## 2010-08-03 DIAGNOSIS — Z1211 Encounter for screening for malignant neoplasm of colon: Secondary | ICD-10-CM

## 2010-08-03 DIAGNOSIS — Z0000 Encounter for general adult medical examination without abnormal findings: Secondary | ICD-10-CM

## 2010-08-03 DIAGNOSIS — R7301 Impaired fasting glucose: Secondary | ICD-10-CM

## 2010-08-03 DIAGNOSIS — E669 Obesity, unspecified: Secondary | ICD-10-CM

## 2010-08-03 MED ORDER — LOSARTAN POTASSIUM-HCTZ 50-12.5 MG PO TABS: 1.0000 | ORAL_TABLET | Freq: Every day | ORAL | Status: DC

## 2010-08-03 NOTE — Patient Instructions (Addendum)
F/U after November 18, 2010  Fasting lipid, chem 7, HBA1C July 18 or after.  pls cut back on salt, your blood pressure is high at 140/80.  A healthy diet is rich in fruit, vegetables and whole grains. Poultry fish, nuts and beans are a healthy choice for protein rather then red meat. A low sodium diet and drinking 64 ounces of water daily is generally recommended. Oils and sweet should be limited. Carbohydrates especially for those who are diabetic or overweight, should be limited to 34-45 gram per meal. It is important to eat on a regular schedule, at least 3 times daily. Snacks should be primarily fruits, vegetables or nuts.  You will be referred for a colonoscopy to Dr. Kendell Bane It is important that you exercise regularly at least 30 minutes 5 times a week. If you develop chest pain, have severe difficulty breathing, or feel very tired, stop exercising immediately and seek medical attention

## 2010-08-08 ENCOUNTER — Encounter: Payer: Self-pay | Admitting: *Deleted

## 2010-08-08 ENCOUNTER — Encounter: Payer: Self-pay | Admitting: Family Medicine

## 2010-08-08 NOTE — Progress Notes (Signed)
  Subjective:    Patient ID: Erika [name], female    DOB: 1935/12/01, 75 y.o.   MRN: [id number]  The PT is here for annual exam and re-evaluation of chronic medical conditions, medication management and review of recent lab and radiology data.  Preventive health is updated, specifically  Cancer screening, Osteoporosis screening and Immunization.   Questions or concerns regarding consultations or procedures which the PT has had in the interim are  addressed. The PT denies any adverse reactions to current medications since the last visit.  There are no new concerns.  There are no specific complaints   Hypertension This is a chronic problem. The current episode started more than 1 year ago. The problem has been gradually worsening since onset. The problem is uncontrolled. Pertinent negatives include no anxiety, blurred vision, chest pain, headaches, orthopnea, palpitations, peripheral edema, PND or shortness of breath. There are no associated agents to hypertension. Risk factors for coronary artery disease include diabetes mellitus, obesity and post-menopausal state. Past treatments include angiotensin blockers and diuretics. The current treatment provides moderate improvement. There are no compliance problems.    Review of Systems  Eyes: Negative for blurred vision.  Respiratory: Negative for shortness of breath.   Cardiovascular: Negative for chest pain, palpitations, orthopnea and PND.  Neurological: Negative for headaches.   Denies recent fever or chills. Denies sinus pressure, nasal congestion, ear pain or sore throat. Denies chest congestion, productive cough or wheezing. Denies chest pains, palpitations, paroxysmal nocturnal dyspnea, orthopnea and leg swelling Denies abdominal pain, nausea, vomiting,diarrhea or constipation.  Denies rectal bleeding or change in bowel movement. Denies dysuria, frequency, hesitancy or incontinence. Denies joint pain, swelling and limitation and  mobility.chronic back pain. Denies headaches, seizure, numbness, or tingling. Denies depression, anxiety or insomnia. Denies skin break down or rash.         Objective:   Physical Exam Pleasant well nourished female, alert and oriented x 3, in no cardio-pulmonary distress. Afebrile. HEENT No facial trauma or asymetry.   EOMI, PERTL, fundoscopic exam is negative for hemorhages or exudates. External ears normal, tympanic membranes clear. Oropharynx moist, no exudate, good dentition. Neck: supple, no adenopathy,JVD or thyromegaly.No bruits.  Chest: Clear to ascultation bilaterally.No crackles or wheezes. Non tender to palpation  Breast: No asymetry,no masses. No nipple discharge or inversion. No axillary or supraclavicular adenopathy  Cardiovascular system; Heart sounds normal,  S1 and  S2 ,no S3.  No murmur, or thrill. Apical beat not displaced Peripheral pulses normal.no edema.  Abdomen: Soft, non tender, no organomegaly or masses. No bruits. Bowel sounds normal. No guarding, tenderness or rebound.  Rectal:  No mass. guaic negative stool.  GU: External genitalia normal. No lesions. Vaginal canal normal.No discharge. Uterus absent, no adnexal masses, no  adnexal tenderness.  Musculoskeletal exam: Decreased  ROM of spine,adequate in  hips , shoulders and knees. No deformity ,swelling or crepitus noted. No muscle wasting or atrophy.   Neurologic: Cranial nerves 2 to 12 intact. Power, tone ,sensation and reflexes normal throughout. No disturbance in gait. No tremor.  Skin: Intact, no ulceration, erythema , scaling or rash noted. Pigmentation normal throughout  Psych; Normal mood and affect. Judgement and concentration normal Memory intact.        Assessment & Plan:  1. Hypertension; uncontrolled, no med change at this time. Increased activity and DASH diet discussed 2. Obesity: improved 3.h/o diabetes: will check hBA1C

## 2010-08-16 ENCOUNTER — Telehealth: Payer: Self-pay

## 2010-08-16 DIAGNOSIS — Z139 Encounter for screening, unspecified: Secondary | ICD-10-CM

## 2010-08-16 NOTE — Telephone Encounter (Signed)
LMOM for pt to call to schedule colonoscopy.  

## 2010-08-17 NOTE — Telephone Encounter (Signed)
OK for colonoscopy.  

## 2010-08-17 NOTE — Telephone Encounter (Signed)
Gastroenterology Pre-Procedure Form  Request Date: 08/15/2010    Requesting Physician: Dr. Lodema Hong     PATIENT INFORMATION:  Erika [name] is a 75 y.o., female (DOB=[birth year]).  PROCEDURE: Procedure(s) requested: colonoscopy Procedure Reason: screening for colon cancer  PATIENT REVIEW QUESTIONS: The patient reports the following:   1. Diabetes Melitis: Yes    (Diet Controlled) 2. Joint replacements in the past 12 months: no 3. Major health problems in the past 3 months: no 4. Has an artificial valve or MVP:no 5. Has been advised in past to take antibiotics in advance of a procedure like teeth cleaning: no}    MEDICATIONS & ALLERGIES:    Patient reports the following regarding taking any blood thinners:   Plavix? no Aspirin? Yes   81 Coumadin?  no  Patient confirms/reports the following medications:  Current Outpatient Prescriptions  Medication Sig Dispense Refill  . aspirin (ANACIN) 81 MG EC tablet Take 81 mg by mouth daily. Take one tablet by mouth daily         . b complex vitamins tablet Take 1 tablet by mouth daily.        . Calcium Carbonate-Vitamin D (CALCIUM 600/VITAMIN D) 600-400 MG-UNIT per chew tablet Chew 1 tablet by mouth daily. Take one tablet by mouth two times a day       . cetirizine (ZYRTEC HIVES RELIEF) 10 MG tablet Take 10 mg by mouth daily.        Marland Kitchen losartan-hydrochlorothiazide (HYZAAR) 50-12.5 MG per tablet Take 1 tablet by mouth daily.  90 tablet  0  . Multiple Vitamins-Minerals (CENTRUM SILVER) tablet Take 1 tablet by mouth daily. One tablet by mouth once daily         Patient confirms/reports the following allergies:  Allergies  Allergen Reactions  . Benazepril-Hydrochlorothiazide     REACTION: cough  . Gabapentin Itching    Patient is appropriate to schedule for requested procedure(s): yes  AUTHORIZATION INFORMATION Primary Insurance:Medicare     ID #: [id number]  Pre-Cert / Berkley Harvey required: Pre-Cert / Auth #:  Secondary Insurance: [place]     ID #: [id number]    Pre-Cert / Auth required Pre-Cert / Auth #:  Orders Placed This Encounter  Procedures  . Endoscopy, colon, diagnostic    Standing Status: Future     Number of Occurrences:      Standing Expiration Date: 08/17/2011    Order Specific Question:  Pre-op diagnosis    Answer:  Screening colonoscopy    Order Specific Question:  Pre-op visit required?    Answer:  No [0]    SCHEDULE INFORMATION: Procedure has been scheduled as follows:  Date: 09/22/2010    Time: 10:30 AM  Location: [facility] Short Stay  This Gastroenterology Pre-Precedure Form is being routed to the following provider(s) for review: Lorenza Burton

## 2010-08-22 ENCOUNTER — Encounter: Payer: Self-pay | Admitting: Family Medicine

## 2010-09-05 ENCOUNTER — Telehealth: Payer: Self-pay

## 2010-09-05 NOTE — Telephone Encounter (Signed)
Per Crystal pt has been moved from 10:30 am on 09/22/2010 to 11:15 Am and will need to be at Short Stay at 10:15 am to register. LMOM for pt the full change, and told her to call if she has questions.

## 2010-09-06 NOTE — Telephone Encounter (Signed)
Pt is aware of the time change. She will check with ride and will call back to let me know if that is OK.

## 2010-09-08 NOTE — Telephone Encounter (Signed)
Called and left message for pt to call and let me know if she has gotten the ride worked out for day of procedure or do we need to reschedule?

## 2010-09-12 NOTE — Telephone Encounter (Signed)
Called and left message for pt to call and let me know if she has worked out a ride for the day of her procedure.

## 2010-09-13 NOTE — Letter (Signed)
March 03, 2008    Erika [name]. Erika Hong, MD  [address], [address]  [place], [place] [address]   RE:  Erika [name], [name]  MRN:  [id number]  /  DOB:  03-12-1936   Dear Erika [name]:   It was my pleasure evaluating Erika [name] in the office today at your  request for an abnormal EKG.  As you know, this nice woman has enjoyed  generally good health.  She does have mild hypertension that has been  well controlled with medical therapy.  She previously suffered from  diabetes, but this condition reversed when she lost a significant amount  of weight, which peaked at 300 pounds, but subsequently decreased to  approximately 200 pounds.  She was seen by my previous partner, Dr.  Dorethea Clan, in 2006 and underwent a stress nuclear study that was entirely  negative.  The left ventricular systolic function was normal.   Past medical history is otherwise only notable for endometriosis that  required TAH-BSO in 1986.   She has no allergies.   Her only pharmacologic agents are Benicar/hydrochlorothiazide 20/12.5 mg  daily, aspirin, and Caltrate.   Social history, family history, and review of systems were updated.  There were no notable changes.  Erika [name] continues to be quite  active, walking 2 miles per day.  She limits her caloric intake, but  weight loss has stalled in recent years.   PHYSICAL EXAMINATION:  GENERAL:  A very pleasant, well-appearing woman.  VITAL SIGNS:  The weight is 208, 6 pounds more than in 2006.  Blood  pressure 115/65, heart rate 65 and regular, respirations 14.  NECK:  No jugular venous distention; normal carotid upstrokes without  bruits.  HEENT:  Anicteric sclerae; normal lids and conjunctivae; normal oral  mucosa.  ENDOCRINE:  No thyromegaly.  HEMATOPOIETIC:  No adenopathy.  SKIN:  No significant lesions.  LUNGS:  Clear.  CARDIAC:  Normal first and second heart sounds; PMI not palpable.  ABDOMEN:  Soft and nontender; no masses; no organomegaly.  EXTREMITIES:   Normal distal pulses; no edema.  NEUROLOGIC:  Symmetric strength and tone; normal cranial nerves.   EKG:  Your tracing obtained last month is reviewed.  Sinus rhythm is  present with borderline left atrial abnormality and low voltage in the V  leads.  The computer reading suggests nonspecific ST-segment  abnormality, but the ST-segment looks fairly normal to me.  There is no  change when compared to an EKG obtained in 2006.   Additional laboratory was kindly provided by your office.  A recent CBC  was normal except for minimally depressed platelets.  Chemistry profile  was normal.  Lipid profile was quite good.   IMPRESSION:  Erika [name] is doing well overall.  Cardiovascular risk  factors are optimally controlled.  She has no symptoms to suggest  myocardial ischemia.  Her EKG abnormalities are not more oppressive than  one would expect in a woman her age.  I have reassured her that  everything looks good from my point of view and will be happy to see her  again at any time you deem appropriate.    Sincerely,      Erika Friends. Dietrich Pates, MD, [facility]  Electronically Signed    RMR/MedQ  DD: 03/03/2008  DT: 03/04/2008  Job #: [id number]

## 2010-09-15 NOTE — Telephone Encounter (Signed)
LMOM to call.

## 2010-09-16 NOTE — Procedures (Signed)
NAME:  GARLENE, APPERSON NO.:  [id number]   MEDICAL RECORD NO.:  [id number]          PATIENT TYPE:  REC   LOCATION:                                FACILITY:  [facility]   PHYSICIAN:  Charlton Haws, M.D.     DATE OF BIRTH:  [birth year]   DATE OF PROCEDURE:  DATE OF DISCHARGE:                                    STRESS TEST   EXERCISE MYOVIEW:  Ms. Suliman is a 75 year old female with no known coronary disease. Cardiac  risk factors include diabetes and hypertension.  She has no cardiopulmonary  symptoms.   BASELINE DATA:  Electrocardiogram reveals a sinus rhythm at 59 beats per  minute with nonspecific ST abnormalities. Blood pressure is 122/78.   The patient exercised for a total of 7 minutes and 4 seconds to Bruce  protocol stage III and 9.3 mets. Maximum heart rate achieved was 145 beats  per minute, which is 94% of predicted maximum. Maximum blood pressure was  150/88 and resolved down to 128/78 in recovery. EKG revealed no ischemic  changes.  A few PVCs and PVCs were noted; however, no significant  arrhythmias.   Myoview was injected 1 minute prior to cessation of exercise.   Final images and results are pending M.D. review.      Amy B   AB/MEDQ  D:  10/28/2004  T:  10/28/2004  Job:  [id number]

## 2010-09-16 NOTE — Telephone Encounter (Signed)
Pt returned call, her ride has been confirmed for the day of procedure.

## 2010-09-22 ENCOUNTER — Ambulatory Visit (HOSPITAL_COMMUNITY)
Admission: RE | Admit: 2010-09-22 | Discharge: 2010-09-22 | Disposition: A | Discharge status: Home / Self Care | Payer: Medicare Other | Source: Ambulatory Visit | Attending: Internal Medicine | Admitting: Internal Medicine

## 2010-09-22 ENCOUNTER — Encounter: Payer: Medicare Other | Admitting: Internal Medicine

## 2010-09-22 DIAGNOSIS — Z1211 Encounter for screening for malignant neoplasm of colon: Secondary | ICD-10-CM | POA: Insufficient documentation

## 2010-09-22 DIAGNOSIS — Z7982 Long term (current) use of aspirin: Secondary | ICD-10-CM | POA: Insufficient documentation

## 2010-09-22 DIAGNOSIS — I10 Essential (primary) hypertension: Secondary | ICD-10-CM | POA: Insufficient documentation

## 2010-09-22 DIAGNOSIS — Z8371 Family history of colonic polyps: Secondary | ICD-10-CM

## 2010-09-22 DIAGNOSIS — Z79899 Other long term (current) drug therapy: Secondary | ICD-10-CM | POA: Insufficient documentation

## 2010-09-23 LAB — GLUCOSE, CAPILLARY: Glucose-Capillary: 112 mg/dL — ABNORMAL HIGH (ref 70–99)

## 2010-09-23 NOTE — Op Note (Signed)
  NAME:  Erika [name], Erika [name] NO.:  [id number]  MEDICAL RECORD NO.:  [id number]           PATIENT TYPE:  O  LOCATION:  [facility]                          FACILITY:  [facility]  PHYSICIAN:  R. Roetta Sessions, M.D. DATE OF BIRTH:  [birth year]  DATE OF PROCEDURE:  09/22/2010 DATE OF DISCHARGE:                              OPERATIVE REPORT   PROCEDURE:  Colonoscopy screening.  INDICATIONS FOR PROCEDURE:  A 75 year old lady who comes for screening colonoscopy, had a negative flexible sigmoidoscopy back in 2011.  No GI symptoms at this time.  No family history of polyps, colon cancer aside from a sister that may have had a polyp taken out her recently in her 20s.  Colonoscopy is now being done.  Risks, benefits, limitations, alternatives, imponderables have been reviewed, questions answered. Please see the documentation in the medical record.  PROCEDURE NOTE:  O2 saturation, blood pressure, pulse, respirations were monitored throughout the entire procedure.  CONSCIOUS SEDATION:  Versed 3 mg IV, Demerol 50 mg IV in divided doses.  INSTRUMENT:  Pentax video chip system.  FINDINGS:  Digital rectal exam revealed no abnormalities.  Endoscopic findings:  Prep was adequate.  Colon:  Colonic mucosa was surveyed from the rectosigmoid junction through the left transverse right colon to the appendiceal orifice, ileocecal valve/cecum.  These structures were well seen and photographed for the record.  From this level, scope was slowly and cautiously withdrawn.  All previously mentioned mucosal surfaces were again seen.  The colonic mucosa appeared normal.  Scope was pulled down into the rectum where a thorough examination of rectal mucosa including retroflexed view of the anal verge demonstrated no abnormalities.  The patient tolerated the procedure well.  Cecal withdrawal time 8 minutes.  IMPRESSION: 1. Normal colon. 2. Normal rectum.  RECOMMENDATIONS:  Repeat screening colonoscopy in 10  years.     Jonathon Bellows, M.D.     RMR/MEDQ  D:  09/22/2010  T:  09/23/2010  Job:  [id number]  cc:   Milus Mallick. Lodema Hong, M.D. Fax: [phone]  Electronically Signed by Lorrin Goodell M.D. on 09/23/2010 01:49:43 PM

## 2010-11-18 LAB — BASIC METABOLIC PANEL
CO2: 30 mEq/L (ref 19–32)
Calcium: 9.7 mg/dL (ref 8.4–10.5)
Chloride: 101 mEq/L (ref 96–112)
Creat: 0.7 mg/dL (ref 0.50–1.10)
Sodium: 141 mEq/L (ref 135–145)

## 2010-11-18 LAB — HEMOGLOBIN A1C: Hgb A1c MFr Bld: 6.1 % — ABNORMAL HIGH (ref <5.7)

## 2010-11-18 LAB — LIPID PANEL
HDL: 48 mg/dL (ref >39)
Total CHOL/HDL Ratio: 3.6 Ratio
Triglycerides: 150 mg/dL — ABNORMAL HIGH (ref <150)

## 2010-11-23 ENCOUNTER — Encounter: Payer: Self-pay | Admitting: Family Medicine

## 2010-11-23 ENCOUNTER — Ambulatory Visit (INDEPENDENT_AMBULATORY_CARE_PROVIDER_SITE_OTHER): Payer: Medicare Other | Admitting: Family Medicine

## 2010-11-23 VITALS — BP 112/70 | HR 77 | Resp 16 | Ht 67.5 in | Wt 204.1 lb

## 2010-11-23 DIAGNOSIS — R7301 Impaired fasting glucose: Secondary | ICD-10-CM

## 2010-11-23 DIAGNOSIS — J309 Allergic rhinitis, unspecified: Secondary | ICD-10-CM

## 2010-11-23 DIAGNOSIS — I10 Essential (primary) hypertension: Secondary | ICD-10-CM

## 2010-11-23 MED ORDER — LOSARTAN POTASSIUM-HCTZ 50-12.5 MG PO TABS: 1.0000 | ORAL_TABLET | Freq: Every day | ORAL | Status: DC

## 2010-11-23 NOTE — Patient Instructions (Addendum)
F/u in 3 monthas.  You absolutely need to start walking 1 hour daily (three 20 minute sessions)  YOU NEED TO START EATING THE CORRECT WAY FOR A DIABETIC THOUGH YOU ARE NOT YET DIABETIC  HBA1C in 3 month, non fasting  I expect GREAT things from you

## 2010-12-04 NOTE — Assessment & Plan Note (Signed)
Controlled, no change in medication  

## 2010-12-04 NOTE — Progress Notes (Signed)
  Subjective:    Patient ID: Erika [name], female    DOB: [birth year], 75 y.o.   MRN: [id number]  HPI The PT is here for follow up and re-evaluation of chronic medical conditions, medication management and review of any available recent lab and radiology data.  Preventive health is updated, specifically  Cancer screening and Immunization.   Questions or concerns regarding consultations or procedures which the PT has had in the interim are  addressed. The PT denies any adverse reactions to current medications since the last visit.  There are no new concerns.  There are no specific complaints       Review of Systems Denies recent fever or chills. Denies sinus pressure, nasal congestion, ear pain or sore throat. Denies chest congestion, productive cough or wheezing. Denies chest pains, palpitations and leg swelling Denies abdominal pain, nausea, vomiting,diarrhea or constipation.   Denies dysuria, frequency, hesitancy or incontinence. Denies joint pain, swelling and limitation in mobility. Denies headaches, seizures, numbness, or tingling. Denies depression, anxiety or insomnia. Denies skin break down or rash.        Objective:   Physical Exam Patient alert and oriented and in no cardiopulmonary distress.  HEENT: No facial asymmetry, EOMI, no sinus tenderness,  oropharynx pink and moist.  Neck supple no adenopathy.  Chest: Clear to auscultation bilaterally.  CVS: S1, S2 no murmurs, no S3.  ABD: Soft non tender. Bowel sounds normal.  Ext: No edema  MS: Adequate ROM spine, shoulders, hips and knees.  Skin: Intact, no ulcerations or rash noted.  Psych: Good eye contact, normal affect. Memory intact not anxious or depressed appearing.  CNS: CN 2-12 intact, power, tone and sensation normal throughout.        Assessment & Plan:

## 2010-12-04 NOTE — Assessment & Plan Note (Signed)
Deteriorated, HBA1C has increased, pt has a past h/o diabetes on insulin, needs tpo b aggressive and proactive about blood sugar control

## 2010-12-04 NOTE — Assessment & Plan Note (Signed)
Uncontrolled, incrased symptoms, will prescribe med

## 2011-01-06 ENCOUNTER — Telehealth: Payer: Self-pay | Admitting: Family Medicine

## 2011-01-06 MED ORDER — LOSARTAN POTASSIUM-HCTZ 50-12.5 MG PO TABS: 1.0000 | ORAL_TABLET | Freq: Every day | ORAL | Status: DC

## 2011-01-06 NOTE — Telephone Encounter (Signed)
Was sent on 7/25. Advised we sent it in again

## 2011-02-23 LAB — HEMOGLOBIN A1C: Hgb A1c MFr Bld: 5.6 % (ref <5.7)

## 2011-02-24 ENCOUNTER — Encounter: Payer: Self-pay | Admitting: Family Medicine

## 2011-03-01 ENCOUNTER — Encounter: Payer: Self-pay | Admitting: Family Medicine

## 2011-03-01 ENCOUNTER — Ambulatory Visit (INDEPENDENT_AMBULATORY_CARE_PROVIDER_SITE_OTHER): Payer: Medicare Other | Admitting: Family Medicine

## 2011-03-01 VITALS — BP 110/70 | HR 59 | Resp 16 | Ht 67.5 in | Wt 198.8 lb

## 2011-03-01 DIAGNOSIS — I10 Essential (primary) hypertension: Secondary | ICD-10-CM

## 2011-03-01 DIAGNOSIS — R7309 Other abnormal glucose: Secondary | ICD-10-CM

## 2011-03-01 DIAGNOSIS — R7301 Impaired fasting glucose: Secondary | ICD-10-CM

## 2011-03-01 DIAGNOSIS — E669 Obesity, unspecified: Secondary | ICD-10-CM

## 2011-03-01 DIAGNOSIS — R7303 Prediabetes: Secondary | ICD-10-CM

## 2011-03-01 DIAGNOSIS — Z23 Encounter for immunization: Secondary | ICD-10-CM

## 2011-03-01 DIAGNOSIS — E663 Overweight: Secondary | ICD-10-CM

## 2011-03-01 DIAGNOSIS — R5381 Other malaise: Secondary | ICD-10-CM

## 2011-03-01 NOTE — Assessment & Plan Note (Signed)
Improved, also blood sugars are normalized

## 2011-03-01 NOTE — Assessment & Plan Note (Signed)
Controlled, no change in medication  

## 2011-03-01 NOTE — Assessment & Plan Note (Signed)
Improved. Pt applauded on succesful weight loss through lifestyle change, and encouraged to continue same. Weight loss goal set for the next several months.  

## 2011-03-01 NOTE — Progress Notes (Signed)
  Subjective:    Patient ID: Erika [name], female    DOB: 11/04/1935, 75 y.o.   MRN: [id number]  HPI The PT is here for follow up and re-evaluation of chronic medical conditions, medication management and review of any available recent lab and radiology data.  Preventive health is updated, specifically  Cancer screening and Immunization.   Questions or concerns regarding consultations or procedures which the PT has had in the interim are  addressed. The PT denies any adverse reactions to current medications since the last visit.  There are no new concerns.  There are no specific complaints       Review of Systems Denies recent fever or chills. Denies sinus pressure, nasal congestion, ear pain or sore throat. Denies chest congestion, productive cough or wheezing. Denies chest pains, palpitations and leg swelling Denies abdominal pain, nausea, vomiting,diarrhea or constipation.   Denies dysuria, frequency, hesitancy or incontinence. Denies joint pain, swelling and limitation in mobility. Denies headaches, seizures, numbness, or tingling. Denies depression, anxiety or insomnia. Denies skin break down or rash.        Objective:   Physical Exam Patient alert and oriented and in no cardiopulmonary distress.  HEENT: No facial asymmetry, EOMI, no sinus tenderness,  oropharynx pink and moist.  Neck supple no adenopathy.  Chest: Clear to auscultation bilaterally.  CVS: S1, S2 no murmurs, no S3.  ABD: Soft non tender. Bowel sounds normal.  Ext: No edema  MS: Adequate ROM spine, shoulders, hips and knees.  Skin: Intact, no ulcerations or rash noted.  Psych: Good eye contact, normal affect. Memory intact not anxious or depressed appearing.  CNS: CN 2-12 intact, power, tone and sensation normal throughout.        Assessment & Plan:   

## 2011-03-01 NOTE — Patient Instructions (Addendum)
F/u mid March.  Congrats on lifestyle change, 6 pound weight loss, and normal blood sugars. Keep it up!  It is important that you exercise regularly at least 30 minutes 7 times a week. If you develop chest pain, have severe difficulty breathing, or feel very tired, stop exercising immediately and seek medical attention  A healthy diet is rich in fruit, vegetables and whole grains. Poultry fish, nuts and beans are a healthy choice for protein rather then red meat. A low sodium diet and drinking 64 ounces of water daily is generally recommended. Oils and sweet should be limited. Carbohydrates especially for those who are diabetic or overweight, should be limited to 30-45 gram per meal. It is important to eat on a regular schedule, at least 3 times daily. Snacks should be primarily fruits, vegetables or nuts.  Weight loss goal is 8 pounds.   Fasting chem 7 and hBA1C just before next visit, cBC and tSH   tdaP today

## 2011-06-09 ENCOUNTER — Other Ambulatory Visit: Payer: Self-pay

## 2011-06-09 MED ORDER — LOSARTAN POTASSIUM-HCTZ 50-12.5 MG PO TABS: 1.0000 | ORAL_TABLET | Freq: Every day | ORAL | Status: DC

## 2011-07-10 DIAGNOSIS — R5383 Other fatigue: Secondary | ICD-10-CM | POA: Diagnosis not present

## 2011-07-10 DIAGNOSIS — R7309 Other abnormal glucose: Secondary | ICD-10-CM | POA: Diagnosis not present

## 2011-07-10 DIAGNOSIS — I10 Essential (primary) hypertension: Secondary | ICD-10-CM | POA: Diagnosis not present

## 2011-07-11 LAB — CBC WITH DIFFERENTIAL/PLATELET
Hemoglobin: 13.5 g/dL (ref 12.0–15.0)
Lymphocytes Relative: 33 % (ref 12–46)
Lymphs Abs: 1.8 10*3/uL (ref 0.7–4.0)
MCH: 31.1 pg (ref 26.0–34.0)
Monocytes Relative: 10 % (ref 3–12)
Neutro Abs: 3 10*3/uL (ref 1.7–7.7)
Neutrophils Relative %: 55 % (ref 43–77)
Platelets: 138 10*3/uL — ABNORMAL LOW (ref 150–400)
RBC: 4.34 MIL/uL (ref 3.87–5.11)
WBC: 5.4 10*3/uL (ref 4.0–10.5)

## 2011-07-11 LAB — HEMOGLOBIN A1C
Hgb A1c MFr Bld: 5.5 % (ref <5.7)
Mean Plasma Glucose: 111 mg/dL (ref <117)

## 2011-07-11 LAB — BASIC METABOLIC PANEL
Calcium: 9.8 mg/dL (ref 8.4–10.5)
Glucose, Bld: 93 mg/dL (ref 70–99)
Potassium: 4.5 mEq/L (ref 3.5–5.3)
Sodium: 143 mEq/L (ref 135–145)

## 2011-07-11 LAB — TSH: TSH: 3.359 u[IU]/mL (ref 0.350–4.500)

## 2011-07-19 ENCOUNTER — Ambulatory Visit (INDEPENDENT_AMBULATORY_CARE_PROVIDER_SITE_OTHER): Payer: Medicare Other | Admitting: Family Medicine

## 2011-07-19 ENCOUNTER — Encounter: Payer: Self-pay | Admitting: Family Medicine

## 2011-07-19 VITALS — BP 120/64 | HR 55 | Resp 16 | Ht 67.5 in | Wt 187.0 lb

## 2011-07-19 DIAGNOSIS — E663 Overweight: Secondary | ICD-10-CM

## 2011-07-19 DIAGNOSIS — Z6825 Body mass index (BMI) 25.0-25.9, adult: Secondary | ICD-10-CM | POA: Diagnosis not present

## 2011-07-19 DIAGNOSIS — R7301 Impaired fasting glucose: Secondary | ICD-10-CM | POA: Diagnosis not present

## 2011-07-19 DIAGNOSIS — I10 Essential (primary) hypertension: Secondary | ICD-10-CM

## 2011-07-19 DIAGNOSIS — Z13220 Encounter for screening for lipoid disorders: Secondary | ICD-10-CM

## 2011-07-19 DIAGNOSIS — E669 Obesity, unspecified: Secondary | ICD-10-CM | POA: Insufficient documentation

## 2011-07-19 NOTE — Progress Notes (Signed)
  Subjective:    Patient ID: Erika [name], female    DOB: 04/17/1936, 76 y.o.   MRN: [id number]  HPI The PT is here for follow up and re-evaluation of chronic medical conditions, medication management and review of any available recent lab and radiology data.  Preventive health is updated, specifically  Cancer screening and Immunization.   Questions or concerns regarding consultations or procedures which the PT has had in the interim are  addressed. The PT denies any adverse reactions to current medications since the last visit.  There are no new concerns.  There are no specific complaints       Review of Systems See HPI Denies recent fever or chills. Denies sinus pressure, nasal congestion, ear pain or sore throat. Denies chest congestion, productive cough or wheezing. Denies chest pains, palpitations and leg swelling Denies abdominal pain, nausea, vomiting,diarrhea or constipation.   Denies dysuria, frequency, hesitancy or incontinence. Denies joint pain, swelling and limitation in mobility. Denies headaches, seizures, numbness, or tingling. Denies depression, anxiety or insomnia. Denies skin break down or rash.        Objective:   Physical Exam  Patient alert and oriented and in no cardiopulmonary distress.  HEENT: No facial asymmetry, EOMI, no sinus tenderness,  oropharynx pink and moist.  Neck supple no adenopathy.  Chest: Clear to auscultation bilaterally.  CVS: S1, S2 no murmurs, no S3.  ABD: Soft non tender. Bowel sounds normal.  Ext: No edema  MS: Adequate ROM spine, shoulders, hips and knees.  Skin: Intact, no ulcerations or rash noted.  Psych: Good eye contact, normal affect. Memory intact not anxious or depressed appearing.  CNS: CN 2-12 intact, power, tone and sensation normal throughout.       Assessment & Plan:

## 2011-07-19 NOTE — Assessment & Plan Note (Signed)
Improved, pt to cotinue healthy habits

## 2011-07-19 NOTE — Progress Notes (Signed)
Addended by: Kandis Fantasia B on: 07/19/2011 08:36 AM   Modules accepted: Orders

## 2011-07-19 NOTE — Patient Instructions (Addendum)
F/U in end September to October.  Labs and blood pressure are excellent.  Keep mammogram appt and eye exam  You have cured yourself of diabetes.Congrats  Continue exercise program, and healthy eating.  Fasting lipid and chem 7 before visit.  Weight loss goal of 6 to 12 pounds

## 2011-07-19 NOTE — Assessment & Plan Note (Signed)
Pt has cured herself of diabetes, she has lost 11 pounds since last visit, she walks 4 miles per day and counts her carbs

## 2011-07-19 NOTE — Assessment & Plan Note (Signed)
Controlled, no change in medication  

## 2011-08-11 DIAGNOSIS — Z1231 Encounter for screening mammogram for malignant neoplasm of breast: Secondary | ICD-10-CM | POA: Diagnosis not present

## 2011-08-11 DIAGNOSIS — R928 Other abnormal and inconclusive findings on diagnostic imaging of breast: Secondary | ICD-10-CM | POA: Diagnosis not present

## 2011-08-11 DIAGNOSIS — R922 Inconclusive mammogram: Secondary | ICD-10-CM | POA: Diagnosis not present

## 2011-08-14 ENCOUNTER — Telehealth: Payer: Self-pay | Admitting: Family Medicine

## 2011-08-14 ENCOUNTER — Ambulatory Visit: Payer: Medicare Other | Admitting: Family Medicine

## 2011-08-14 DIAGNOSIS — J309 Allergic rhinitis, unspecified: Secondary | ICD-10-CM | POA: Diagnosis not present

## 2011-08-14 DIAGNOSIS — R05 Cough: Secondary | ICD-10-CM | POA: Diagnosis not present

## 2011-08-14 NOTE — Telephone Encounter (Signed)
pls advise her to come in for a work in today asap

## 2011-08-14 NOTE — Telephone Encounter (Signed)
Left message to call back  

## 2011-12-02 ENCOUNTER — Other Ambulatory Visit: Payer: Self-pay | Admitting: Family Medicine

## 2012-01-23 ENCOUNTER — Other Ambulatory Visit: Payer: Self-pay | Admitting: Family Medicine

## 2012-01-23 DIAGNOSIS — E663 Overweight: Secondary | ICD-10-CM | POA: Diagnosis not present

## 2012-01-23 DIAGNOSIS — R7301 Impaired fasting glucose: Secondary | ICD-10-CM | POA: Diagnosis not present

## 2012-01-23 DIAGNOSIS — I10 Essential (primary) hypertension: Secondary | ICD-10-CM | POA: Diagnosis not present

## 2012-01-23 DIAGNOSIS — Z6825 Body mass index (BMI) 25.0-25.9, adult: Secondary | ICD-10-CM | POA: Diagnosis not present

## 2012-01-23 DIAGNOSIS — Z13220 Encounter for screening for lipoid disorders: Secondary | ICD-10-CM | POA: Diagnosis not present

## 2012-01-24 LAB — BASIC METABOLIC PANEL
CO2: 32 mEq/L (ref 19–32)
Calcium: 9.6 mg/dL (ref 8.4–10.5)
Chloride: 104 mEq/L (ref 96–112)
Glucose, Bld: 94 mg/dL (ref 70–99)
Potassium: 4.3 mEq/L (ref 3.5–5.3)
Sodium: 143 mEq/L (ref 135–145)

## 2012-01-24 LAB — LIPID PANEL
HDL: 54 mg/dL (ref >39)
Total CHOL/HDL Ratio: 3.4 Ratio
VLDL: 24 mg/dL (ref 0–40)

## 2012-01-30 ENCOUNTER — Ambulatory Visit (INDEPENDENT_AMBULATORY_CARE_PROVIDER_SITE_OTHER): Payer: Medicare Other | Admitting: Family Medicine

## 2012-01-30 ENCOUNTER — Encounter: Payer: Self-pay | Admitting: Family Medicine

## 2012-01-30 VITALS — BP 130/72 | HR 57 | Resp 15 | Ht 67.5 in | Wt 189.1 lb

## 2012-01-30 DIAGNOSIS — I10 Essential (primary) hypertension: Secondary | ICD-10-CM | POA: Diagnosis not present

## 2012-01-30 DIAGNOSIS — R7301 Impaired fasting glucose: Secondary | ICD-10-CM | POA: Diagnosis not present

## 2012-01-30 DIAGNOSIS — Z1329 Encounter for screening for other suspected endocrine disorder: Secondary | ICD-10-CM

## 2012-01-30 DIAGNOSIS — Z1211 Encounter for screening for malignant neoplasm of colon: Secondary | ICD-10-CM | POA: Diagnosis not present

## 2012-01-30 DIAGNOSIS — Z23 Encounter for immunization: Secondary | ICD-10-CM | POA: Diagnosis not present

## 2012-01-30 DIAGNOSIS — E663 Overweight: Secondary | ICD-10-CM

## 2012-01-30 DIAGNOSIS — Z6825 Body mass index (BMI) 25.0-25.9, adult: Secondary | ICD-10-CM

## 2012-01-30 DIAGNOSIS — R5383 Other fatigue: Secondary | ICD-10-CM | POA: Diagnosis not present

## 2012-01-30 DIAGNOSIS — Z1321 Encounter for screening for nutritional disorder: Secondary | ICD-10-CM

## 2012-01-30 DIAGNOSIS — J309 Allergic rhinitis, unspecified: Secondary | ICD-10-CM

## 2012-01-30 DIAGNOSIS — R5381 Other malaise: Secondary | ICD-10-CM

## 2012-01-30 DIAGNOSIS — M79609 Pain in unspecified limb: Secondary | ICD-10-CM

## 2012-01-30 LAB — POC HEMOCCULT BLD/STL (OFFICE/1-CARD/DIAGNOSTIC): Fecal Occult Blood, POC: NEGATIVE

## 2012-01-30 NOTE — Patient Instructions (Addendum)
Annual wellness in end March/early April, please call if you need me before  Rectal exam today and flu vaccine  Your blood pressure is excellent, no med change   Labs are good, except bad cholesterol is slightly elevated, pls cut back on fried food and red meat  Continue daily physical activity, and healthy eating.  Fasting cBc, lipid, cmp, tsh and vit D and HBA1Cend March  Mammogram due end March, please schedule  in February

## 2012-01-30 NOTE — Progress Notes (Signed)
  Subjective:    Patient ID: Erika [name], female    DOB: 1936-01-19, 76 y.o.   MRN: [id number]  HPI The PT is here for follow up and re-evaluation of chronic medical conditions, medication management and review of any available recent lab and radiology data.  Preventive health is updated, specifically  Cancer screening and Immunization.   Questions or concerns regarding consultations or procedures which the PT has had in the interim are  addressed. The PT denies any adverse reactions to current medications since the last visit.  There are no new concerns.Has gained weight but intends to work hard on losing this  There are no specific complaints       Review of Systems See HPI Denies recent fever or chills. Denies sinus pressure, nasal congestion, ear pain or sore throat. Denies chest congestion, productive cough or wheezing. Denies chest pains, palpitations and leg swelling Denies abdominal pain, nausea, vomiting,diarrhea or constipation.   Denies dysuria, frequency, hesitancy or incontinence. Denies joint pain, swelling and limitation in mobility. Denies headaches, seizures, numbness, or tingling. Denies depression, anxiety or insomnia. Denies skin break down or rash.        Objective:   Physical Exam Patient alert and oriented and in no cardiopulmonary distress.  HEENT: No facial asymmetry, EOMI, no sinus tenderness,  oropharynx pink and moist.  Neck supple no adenopathy.  Chest: Clear to auscultation bilaterally.  CVS: S1, S2 no murmurs, no S3.  ABD: Soft non tender. Bowel sounds normal.  Ext: No edema  MS: Adequate ROM spine, shoulders, hips and knees.  Skin: Intact, no ulcerations or rash noted.  Psych: Good eye contact, normal affect. Memory intact not anxious or depressed appearing.  CNS: CN 2-12 intact, power, tone and sensation normal throughout.        Assessment & Plan:

## 2012-01-31 NOTE — Assessment & Plan Note (Signed)
Normoglycemic 6 months ago, had been diabetic in the past  Patient educated about the importance of limiting  Carbohydrate intake , the need to commit to daily physical activity for a minimum of 30 minutes , and to commit weight loss. The fact that changes in all these areas will reduce or eliminate all together the development of diabetes is stressed.

## 2012-01-31 NOTE — Assessment & Plan Note (Signed)
Controlled, no change in medication No recent flare 

## 2012-01-31 NOTE — Assessment & Plan Note (Signed)
Controlled, no change in medication DASH diet and commitment to daily physical activity for a minimum of 30 minutes discussed and encouraged, as a part of hypertension management. The importance of attaining a healthy weight is also discussed.  

## 2012-02-02 ENCOUNTER — Other Ambulatory Visit: Payer: Self-pay

## 2012-02-02 MED ORDER — LOSARTAN POTASSIUM-HCTZ 50-12.5 MG PO TABS: 1.0000 | ORAL_TABLET | Freq: Every day | ORAL | Status: DC

## 2012-05-29 ENCOUNTER — Telehealth: Payer: Self-pay | Admitting: Family Medicine

## 2012-05-29 MED ORDER — LOSARTAN POTASSIUM-HCTZ 50-12.5 MG PO TABS: 1.0000 | ORAL_TABLET | Freq: Every day | ORAL | Status: DC

## 2012-05-29 NOTE — Telephone Encounter (Signed)
Sent in

## 2012-06-01 ENCOUNTER — Other Ambulatory Visit: Payer: Self-pay | Admitting: Family Medicine

## 2012-07-04 ENCOUNTER — Other Ambulatory Visit: Payer: Self-pay | Admitting: Family Medicine

## 2012-07-04 DIAGNOSIS — Z139 Encounter for screening, unspecified: Secondary | ICD-10-CM

## 2012-07-20 ENCOUNTER — Other Ambulatory Visit: Payer: Self-pay | Admitting: Family Medicine

## 2012-07-21 LAB — TSH: TSH: 1.863 u[IU]/mL (ref 0.350–4.500)

## 2012-07-21 LAB — COMPREHENSIVE METABOLIC PANEL
ALT: 21 U/L (ref 0–35)
AST: 21 U/L (ref 0–37)
Alkaline Phosphatase: 72 U/L (ref 39–117)
BUN: 24 mg/dL — ABNORMAL HIGH (ref 6–23)
Creat: 0.77 mg/dL (ref 0.50–1.10)
Potassium: 4.4 mEq/L (ref 3.5–5.3)

## 2012-07-21 LAB — HEMOGLOBIN A1C: Mean Plasma Glucose: 114 mg/dL (ref <117)

## 2012-07-21 LAB — CBC
HCT: 39.3 % (ref 36.0–46.0)
MCHC: 34.4 g/dL (ref 30.0–36.0)
Platelets: 140 10*3/uL — ABNORMAL LOW (ref 150–400)
RDW: 14.1 % (ref 11.5–15.5)

## 2012-07-21 LAB — LIPID PANEL
HDL: 47 mg/dL (ref >39)
LDL Cholesterol: 99 mg/dL (ref 0–99)
Total CHOL/HDL Ratio: 3.5 Ratio

## 2012-07-31 ENCOUNTER — Encounter: Payer: Self-pay | Admitting: Family Medicine

## 2012-07-31 ENCOUNTER — Ambulatory Visit (INDEPENDENT_AMBULATORY_CARE_PROVIDER_SITE_OTHER): Payer: Medicare Other | Admitting: Family Medicine

## 2012-07-31 VITALS — BP 132/74 | HR 66 | Resp 16 | Ht 67.5 in | Wt 198.0 lb

## 2012-07-31 DIAGNOSIS — R7301 Impaired fasting glucose: Secondary | ICD-10-CM

## 2012-07-31 DIAGNOSIS — Z0000 Encounter for general adult medical examination without abnormal findings: Secondary | ICD-10-CM

## 2012-07-31 DIAGNOSIS — I10 Essential (primary) hypertension: Secondary | ICD-10-CM

## 2012-07-31 NOTE — Patient Instructions (Addendum)
F/u in end September, call iff you need me before   HBA1c, and chem  7 in end September  It is important that you exercise regularly at least 30 minutes 5 times a week. If you develop chest pain, have severe difficulty breathing, or feel very tired, stop exercising immediately and seek medical attention    A healthy diet is rich in fruit, vegetables and whole grains. Poultry fish, nuts and beans are a healthy choice for protein rather then red meat. A low sodium diet and drinking 64 ounces of water daily is generally recommended. Oils and sweet should be limited. Carbohydrates especially for those who are diabetic or overweight, should be limited to 30-45 gram per meal. It is important to eat on a regular schedule, at least 3 times daily. Snacks should be primarily fruits, vegetables or nuts.  Weight loss goal of 10 pounds  Please discuss form with your niec re end of life issues

## 2012-07-31 NOTE — Assessment & Plan Note (Signed)
Annual wellness as documented Pt has named niece as power of attorney but neds to further clarify and get HPOA. She is full code with restrictions on indefinite life support

## 2012-07-31 NOTE — Progress Notes (Signed)
Subjective:    Patient ID: Erika [name], female    DOB: [birth year], 77 y.o.   MRN: [id number]  HPI  Preventive Screening-Counseling & Management   Patient present here today for a Medicare annual wellness visit.   Current Problems (verified)   Medications Prior to Visit Allergies (verified)   PAST HISTORY  Family History 5 sibs dies , 8 living  Social History widow  Since 2005, no children, never tobacco, alcohol, street drugs   Risk Factors  Current exercise habits:  Regularly 5 days per week, none in past 2 weeks due to knee injury  Dietary issues discussed:low carb, low fat, a lot of fruit and veg and water   Cardiac risk factors:   Depression Screen  (Note: if answer to either of the following is "Yes", a more complete depression screening is indicated)   Over the past two weeks, have you felt down, depressed or hopeless? No  Over the past two weeks, have you felt little interest or pleasure in doing things? No  Have you lost interest or pleasure in daily life? No  Do you often feel hopeless? No  Do you cry easily over simple problems? No   Activities of Daily Living  In your present state of health, do you have any difficulty performing the following activities?  Driving?: No Managing money?: No Feeding yourself?:No Getting from bed to chair?:No Climbing a flight of stairs?:No Preparing food and eating?:No Bathing or showering?:No Getting dressed?:No Getting to the toilet?:No Using the toilet?:No Moving around from place to place?: No  Fall Risk Assessment In the past year have you fallen or had a near fall?:No Are you currently taking any medications that make you dizziness?:No   Hearing Difficulties: No Do you often ask people to speak up or repeat themselves?:No Do you experience ringing or noises in your ears?:No Do you have difficulty understanding soft or whispered voices?:No  Cognitive Testing  Alert? Yes Normal Appearance?Yes  Oriented  to person? Yes Place? Yes  Time? Yes  Displays appropriate judgment?Yes  Can read the correct time from a watch face? yes Are you having problems remembering things?No  Advanced Directives have been discussed with the patient?Yes    List the Names of Other Physician/Practitioners you currently use: Dr Dianne Dun any recent Medical Services you may have received from other than [facility] providers in the past year (date may be approximate).   Assessment:    Annual Wellness Exam   Plan:    During the course of the visit the patient was educated and counseled about appropriate screening and preventive services including:  A healthy diet is rich in fruit, vegetables and whole grains. Poultry fish, nuts and beans are a healthy choice for protein rather then red meat. A low sodium diet and drinking 64 ounces of water daily is generally recommended. Oils and sweet should be limited. Carbohydrates especially for those who are diabetic or overweight, should be limited to 30-45 gram per meal. It is important to eat on a regular schedule, at least 3 times daily. Snacks should be primarily fruits, vegetables or nuts. It is important that you exercise regularly at least 30 minutes 5 times a week. If you develop chest pain, have severe difficulty breathing, or feel very tired, stop exercising immediately and seek medical attention  Immunization reviewed and updated. Cancer screening reviewed and updated    Patient Instructions (the written plan) was given to the patient.  Medicare Attestation  I have personally reviewed:  The patient's medical and social history  Their use of alcohol, tobacco or illicit drugs  Their current medications and supplements  The patient's functional ability including ADLs,fall risks, home safety risks, cognitive, and hearing and visual impairment  Diet and physical activities  Evidence for depression or mood disorders  The patient's weight, height, BMI, and visual  acuity have been recorded in the chart. I have made referrals, counseling, and provided education to the patient based on review of the above and I have provided the patient with a written personalized care plan for preventive services.     Review of Systems     Objective:   Physical Exam        Assessment & Plan:

## 2012-08-15 ENCOUNTER — Ambulatory Visit (HOSPITAL_COMMUNITY)
Admission: RE | Admit: 2012-08-15 | Discharge: 2012-08-15 | Disposition: A | Discharge status: Home / Self Care | Payer: Medicare Other | Source: Ambulatory Visit | Attending: Family Medicine | Admitting: Family Medicine

## 2012-08-15 DIAGNOSIS — Z139 Encounter for screening, unspecified: Secondary | ICD-10-CM

## 2012-08-15 DIAGNOSIS — Z1231 Encounter for screening mammogram for malignant neoplasm of breast: Secondary | ICD-10-CM | POA: Insufficient documentation

## 2012-10-08 ENCOUNTER — Ambulatory Visit: Payer: Medicare Other | Admitting: Family Medicine

## 2013-01-17 ENCOUNTER — Other Ambulatory Visit: Payer: Self-pay | Admitting: Family Medicine

## 2013-01-17 LAB — HEMOGLOBIN A1C: Hgb A1c MFr Bld: 5.8 % — ABNORMAL HIGH (ref <5.7)

## 2013-01-18 LAB — BASIC METABOLIC PANEL
Calcium: 9.6 mg/dL (ref 8.4–10.5)
Glucose, Bld: 106 mg/dL — ABNORMAL HIGH (ref 70–99)
Sodium: 140 mEq/L (ref 135–145)

## 2013-01-23 ENCOUNTER — Telehealth: Payer: Self-pay | Admitting: Family Medicine

## 2013-01-23 ENCOUNTER — Ambulatory Visit (INDEPENDENT_AMBULATORY_CARE_PROVIDER_SITE_OTHER): Payer: Medicare Other | Admitting: Family Medicine

## 2013-01-23 ENCOUNTER — Encounter: Payer: Self-pay | Admitting: Family Medicine

## 2013-01-23 VITALS — BP 130/78 | HR 71 | Resp 16 | Ht 67.5 in | Wt 207.0 lb

## 2013-01-23 DIAGNOSIS — E663 Overweight: Secondary | ICD-10-CM

## 2013-01-23 DIAGNOSIS — Z23 Encounter for immunization: Secondary | ICD-10-CM

## 2013-01-23 DIAGNOSIS — R7301 Impaired fasting glucose: Secondary | ICD-10-CM

## 2013-01-23 DIAGNOSIS — I10 Essential (primary) hypertension: Secondary | ICD-10-CM

## 2013-01-23 DIAGNOSIS — Z6825 Body mass index (BMI) 25.0-25.9, adult: Secondary | ICD-10-CM

## 2013-01-23 MED ORDER — LOSARTAN POTASSIUM-HCTZ 50-12.5 MG PO TABS: ORAL_TABLET | ORAL | Status: DC

## 2013-01-23 NOTE — Patient Instructions (Addendum)
F/u in 4.5 months, with rectal  Flu vaccine today   It is important that you exercise regularly at least 30 minutes 5 times a week. If you develop chest pain, have severe difficulty breathing, or feel very tired, stop exercising immediately and seek medical attention   A healthy diet is rich in fruit, vegetables and whole grains. Poultry fish, nuts and beans are a healthy choice for protein rather then red meat. A low sodium diet and drinking 64 ounces of water daily is generally recommended. Oils and sweet should be limited. Carbohydrates especially for those who are diabetic or overweight, should be limited to 60-45 gram per meal. It is important to eat on a regular schedule, at least 3 times daily. Snacks should be primarily fruits, vegetables or nuts.  Please work on 6 pound weight loss in the next 4.5 month  Blood pressure is great, no med change  HBA1C in 4.5 month, before next visit

## 2013-01-23 NOTE — Assessment & Plan Note (Signed)
Deteriorated. Patient re-educated about  the importance of commitment to a  minimum of 150 minutes of exercise per week. The importance of healthy food choices with portion control discussed. Encouraged to start a food diary, count calories and to consider  joining a support group. Sample diet sheets offered. Goals set by the patient for the next several months.    

## 2013-01-23 NOTE — Telephone Encounter (Signed)
BP med sent [facility]

## 2013-01-23 NOTE — Assessment & Plan Note (Signed)
Controlled, no change in medication DASH diet and commitment to daily physical activity for a minimum of 30 minutes discussed and encouraged, as a part of hypertension management. The importance of attaining a healthy weight is also discussed.  

## 2013-01-23 NOTE — Assessment & Plan Note (Signed)
Deteriorated, needs to work on low carb diet and weight loss

## 2013-01-23 NOTE — Progress Notes (Signed)
  Subjective:    Patient ID: Erika [name], female    DOB: [birth year], 77 y.o.   MRN: [id number]  HPI The PT is here for follow up and re-evaluation of chronic medical conditions, medication management and review of any available recent lab and radiology data.  Preventive health is updated, specifically  Cancer screening and Immunization.   Questions or concerns regarding consultations or procedures which the PT has had in the interim are  Addressed.Had eye exam this year The PT denies any adverse reactions to current medications since the last visit.  There are no new concerns.  There are no specific complaints       Review of Systems See HPI Denies recent fever or chills. Denies sinus pressure, nasal congestion, ear pain or sore throat. Denies chest congestion, productive cough or wheezing. Denies chest pains, palpitations and leg swelling Denies abdominal pain, nausea, vomiting,diarrhea or constipation.   Denies dysuria, frequency, hesitancy or incontinence. Denies joint pain, swelling and limitation in mobility. Denies headaches, seizures, numbness, or tingling. Denies depression, anxiety or insomnia. Denies skin break down or rash.    .     Objective:   Physical Exam  Patient alert and oriented and in no cardiopulmonary distress.  HEENT: No facial asymmetry, EOMI, no sinus tenderness,  oropharynx pink and moist.  Neck supple no adenopathy.  Chest: Clear to auscultation bilaterally.  CVS: S1, S2 no murmurs, no S3.  ABD: Soft non tender. Bowel sounds normal.  Ext: No edema  MS: Adequate ROM spine, shoulders, hips and knees.  Skin: Intact, no ulcerations or rash noted.  Psych: Good eye contact, normal affect. Memory intact not anxious or depressed appearing.  CNS: CN 2-12 intact, power, tone and sensation normal throughout.       Assessment & Plan:

## 2013-06-04 LAB — HEMOGLOBIN A1C
Hgb A1c MFr Bld: 5.8 % — ABNORMAL HIGH (ref <5.7)
Mean Plasma Glucose: 120 mg/dL — ABNORMAL HIGH (ref <117)

## 2013-06-05 ENCOUNTER — Ambulatory Visit (INDEPENDENT_AMBULATORY_CARE_PROVIDER_SITE_OTHER): Payer: Medicare Other | Admitting: Family Medicine

## 2013-06-05 ENCOUNTER — Encounter (INDEPENDENT_AMBULATORY_CARE_PROVIDER_SITE_OTHER): Payer: Self-pay

## 2013-06-05 ENCOUNTER — Encounter: Payer: Self-pay | Admitting: Family Medicine

## 2013-06-05 VITALS — BP 130/72 | HR 73 | Resp 16 | Ht 67.5 in | Wt 206.8 lb

## 2013-06-05 DIAGNOSIS — I10 Essential (primary) hypertension: Secondary | ICD-10-CM

## 2013-06-05 DIAGNOSIS — E66811 Obesity, class 1: Secondary | ICD-10-CM

## 2013-06-05 DIAGNOSIS — Z13820 Encounter for screening for osteoporosis: Secondary | ICD-10-CM

## 2013-06-05 DIAGNOSIS — Z139 Encounter for screening, unspecified: Secondary | ICD-10-CM

## 2013-06-05 DIAGNOSIS — E669 Obesity, unspecified: Secondary | ICD-10-CM

## 2013-06-05 DIAGNOSIS — J309 Allergic rhinitis, unspecified: Secondary | ICD-10-CM

## 2013-06-05 DIAGNOSIS — R7301 Impaired fasting glucose: Secondary | ICD-10-CM

## 2013-06-05 MED ORDER — LOSARTAN POTASSIUM-HCTZ 50-12.5 MG PO TABS: ORAL_TABLET | ORAL | Status: DC

## 2013-06-05 NOTE — Progress Notes (Signed)
   Subjective:    Patient ID: Erika [name], female    DOB: Apr 03, 1936, 78 y.o.   MRN: [id number]  HPI The PT is here for follow up and re-evaluation of chronic medical conditions, medication management and review of any available recent lab and radiology data.  Preventive health is updated, specifically  Cancer screening and Immunization.   . The PT denies any adverse reactions to current medications since the last visit.  There are no new concerns , except for inability to lose weight and obesity. States she needs to increase physical activity and cut back on bread, and stop snacking throughout the evening at home There are no specific complaints otherwise      Review of Systems See HPI Denies recent fever or chills. Denies sinus pressure, nasal congestion, ear pain or sore throat. Denies chest congestion, productive cough or wheezing. Denies chest pains, palpitations and leg swelling Denies abdominal pain, nausea, vomiting,diarrhea or constipation.   Denies dysuria, frequency, hesitancy or incontinence. Denies joint pain, swelling and limitation in mobility. Denies headaches, seizures, numbness, or tingling. Denies depression, anxiety or insomnia. Denies skin break down or rash.        Objective:   Physical Exam  Patient alert and oriented and in no cardiopulmonary distress.  HEENT: No facial asymmetry, EOMI, no sinus tenderness,  oropharynx pink and moist.  Neck supple no adenopathy.  Chest: Clear to auscultation bilaterally.  CVS: S1, S2 no murmurs, no S3.  ABD: Soft non tender. Bowel sounds normal.  Ext: No edema  MS: Adequate ROM spine, shoulders, hips and knees.  Skin: Intact, no ulcerations or rash noted.  Psych: Good eye contact, normal affect. Memory intact not anxious or depressed appearing.  CNS: CN 2-12 intact, power, tone and sensation normal throughout.       Assessment & Plan:

## 2013-06-05 NOTE — Assessment & Plan Note (Signed)
No current flare of symptoms, not using meds at this time

## 2013-06-05 NOTE — Assessment & Plan Note (Signed)
Unchanged Patient educated about the importance of limiting  Carbohydrate intake , the need to commit to daily physical activity for a minimum of 30 minutes , and to commit weight loss. The fact that changes in all these areas will reduce or eliminate all together the development of diabetes is stressed.    

## 2013-06-05 NOTE — Assessment & Plan Note (Signed)
Controlled, no change in medication DASH diet and commitment to daily physical activity for a minimum of 30 minutes discussed and encouraged, as a part of hypertension management. The importance of attaining a healthy weight is also discussed.  

## 2013-06-05 NOTE — Patient Instructions (Addendum)
Pelvic and breast in 4 month, call if you need me before  You are referred for a bone density scan , and youur mammogram is due in April , please schedule  Blood sugar is unchanged.Blood pressure is  good  Increase your exercise and go to sleep earlier so you do not snack excessively and lose weight   Fasting li[pid, chem 7, HBa1C, TSH cbc and vit D in 4 month, before vist

## 2013-06-05 NOTE — Assessment & Plan Note (Signed)
Deteriorated. Patient re-educated about  the importance of commitment to a  minimum of 150 minutes of exercise per week. The importance of healthy food choices with portion control discussed. Encouraged to start a food diary, count calories and to consider  joining a support group. Sample diet sheets offered. Goals set by the patient for the next several months.    

## 2013-06-12 ENCOUNTER — Other Ambulatory Visit (HOSPITAL_COMMUNITY): Payer: Medicare Other

## 2013-07-11 ENCOUNTER — Other Ambulatory Visit: Payer: Self-pay | Admitting: Family Medicine

## 2013-07-11 DIAGNOSIS — Z1231 Encounter for screening mammogram for malignant neoplasm of breast: Secondary | ICD-10-CM

## 2013-07-17 ENCOUNTER — Ambulatory Visit (HOSPITAL_COMMUNITY)
Admission: RE | Admit: 2013-07-17 | Discharge: 2013-07-17 | Disposition: A | Discharge status: Home / Self Care | Payer: Medicare Other | Source: Ambulatory Visit | Attending: Family Medicine | Admitting: Family Medicine

## 2013-07-17 DIAGNOSIS — Z780 Asymptomatic menopausal state: Secondary | ICD-10-CM | POA: Insufficient documentation

## 2013-07-17 DIAGNOSIS — Z13820 Encounter for screening for osteoporosis: Secondary | ICD-10-CM

## 2013-08-21 ENCOUNTER — Ambulatory Visit (HOSPITAL_COMMUNITY)
Admission: RE | Admit: 2013-08-21 | Discharge: 2013-08-21 | Disposition: A | Discharge status: Home / Self Care | Payer: Medicare Other | Source: Ambulatory Visit | Attending: Family Medicine | Admitting: Family Medicine

## 2013-08-21 DIAGNOSIS — Z1231 Encounter for screening mammogram for malignant neoplasm of breast: Secondary | ICD-10-CM | POA: Insufficient documentation

## 2013-09-26 ENCOUNTER — Telehealth: Payer: Self-pay | Admitting: *Deleted

## 2013-09-26 MED ORDER — LOSARTAN POTASSIUM-HCTZ 50-12.5 MG PO TABS: ORAL_TABLET | ORAL | Status: DC

## 2013-09-26 NOTE — Telephone Encounter (Signed)
Pt called stating her leg is bothering her and she has a appointment 10/02/13, pt wanted to know if she can get losartan called in to [facility] pt states that she is getting ready to leave and if she does not answer her cell phone she will call back. [phone].

## 2013-09-26 NOTE — Telephone Encounter (Signed)
Pt aware, med sent  

## 2013-09-30 LAB — CBC WITH DIFFERENTIAL/PLATELET
BASOS ABS: 0 10*3/uL (ref 0.0–0.1)
Basophils Relative: 0 % (ref 0–1)
Eosinophils Absolute: 0.1 10*3/uL (ref 0.0–0.7)
Eosinophils Relative: 2 % (ref 0–5)
HCT: 37.7 % (ref 36.0–46.0)
Hemoglobin: 12.7 g/dL (ref 12.0–15.0)
Lymphocytes Relative: 36 % (ref 12–46)
Lymphs Abs: 1.7 10*3/uL (ref 0.7–4.0)
MCH: 31.1 pg (ref 26.0–34.0)
MCHC: 33.7 g/dL (ref 30.0–36.0)
MCV: 92.4 fL (ref 78.0–100.0)
Monocytes Absolute: 0.4 10*3/uL (ref 0.1–1.0)
Monocytes Relative: 8 % (ref 3–12)
NEUTROS ABS: 2.5 10*3/uL (ref 1.7–7.7)
NEUTROS PCT: 54 % (ref 43–77)
Platelets: 138 10*3/uL — ABNORMAL LOW (ref 150–400)
RBC: 4.08 MIL/uL (ref 3.87–5.11)
RDW: 13.1 % (ref 11.5–15.5)
WBC: 4.6 10*3/uL (ref 4.0–10.5)

## 2013-09-30 LAB — LIPID PANEL
CHOL/HDL RATIO: 3.1 ratio
Cholesterol: 152 mg/dL (ref 0–200)
HDL: 49 mg/dL (ref >39)
LDL CALC: 79 mg/dL (ref 0–99)
Triglycerides: 119 mg/dL (ref <150)
VLDL: 24 mg/dL (ref 0–40)

## 2013-09-30 LAB — HEMOGLOBIN A1C
Hgb A1c MFr Bld: 5.4 % (ref <5.7)
Mean Plasma Glucose: 108 mg/dL (ref <117)

## 2013-09-30 LAB — BASIC METABOLIC PANEL
BUN: 21 mg/dL (ref 6–23)
CALCIUM: 9.3 mg/dL (ref 8.4–10.5)
CO2: 31 mEq/L (ref 19–32)
Chloride: 103 mEq/L (ref 96–112)
Creat: 0.62 mg/dL (ref 0.50–1.10)
Glucose, Bld: 86 mg/dL (ref 70–99)
Potassium: 4.2 mEq/L (ref 3.5–5.3)
SODIUM: 142 meq/L (ref 135–145)

## 2013-09-30 LAB — TSH: TSH: 2.1 u[IU]/mL (ref 0.350–4.500)

## 2013-09-30 LAB — VITAMIN D 25 HYDROXY (VIT D DEFICIENCY, FRACTURES): Vit D, 25-Hydroxy: 53 ng/mL (ref 30–89)

## 2013-10-02 ENCOUNTER — Encounter: Payer: Self-pay | Admitting: Family Medicine

## 2013-10-02 ENCOUNTER — Other Ambulatory Visit (HOSPITAL_COMMUNITY)
Admission: RE | Admit: 2013-10-02 | Discharge: 2013-10-02 | Disposition: A | Discharge status: Home / Self Care | Payer: Medicare Other | Source: Ambulatory Visit | Attending: Family Medicine | Admitting: Family Medicine

## 2013-10-02 ENCOUNTER — Ambulatory Visit (INDEPENDENT_AMBULATORY_CARE_PROVIDER_SITE_OTHER): Payer: Medicare Other | Admitting: Family Medicine

## 2013-10-02 ENCOUNTER — Encounter (INDEPENDENT_AMBULATORY_CARE_PROVIDER_SITE_OTHER): Payer: Self-pay

## 2013-10-02 VITALS — BP 148/70 | HR 70 | Resp 16 | Wt 186.8 lb

## 2013-10-02 DIAGNOSIS — Z1272 Encounter for screening for malignant neoplasm of vagina: Secondary | ICD-10-CM

## 2013-10-02 DIAGNOSIS — B3789 Other sites of candidiasis: Secondary | ICD-10-CM | POA: Diagnosis not present

## 2013-10-02 DIAGNOSIS — R7309 Other abnormal glucose: Secondary | ICD-10-CM

## 2013-10-02 DIAGNOSIS — M79609 Pain in unspecified limb: Secondary | ICD-10-CM

## 2013-10-02 DIAGNOSIS — R1031 Right lower quadrant pain: Secondary | ICD-10-CM | POA: Diagnosis not present

## 2013-10-02 DIAGNOSIS — Z1211 Encounter for screening for malignant neoplasm of colon: Secondary | ICD-10-CM | POA: Diagnosis not present

## 2013-10-02 DIAGNOSIS — H547 Unspecified visual loss: Secondary | ICD-10-CM | POA: Diagnosis not present

## 2013-10-02 DIAGNOSIS — Z79899 Other long term (current) drug therapy: Secondary | ICD-10-CM

## 2013-10-02 DIAGNOSIS — I10 Essential (primary) hypertension: Secondary | ICD-10-CM

## 2013-10-02 DIAGNOSIS — R7302 Impaired glucose tolerance (oral): Secondary | ICD-10-CM

## 2013-10-02 DIAGNOSIS — Z0000 Encounter for general adult medical examination without abnormal findings: Secondary | ICD-10-CM

## 2013-10-02 DIAGNOSIS — B351 Tinea unguium: Secondary | ICD-10-CM

## 2013-10-02 DIAGNOSIS — Z124 Encounter for screening for malignant neoplasm of cervix: Secondary | ICD-10-CM | POA: Insufficient documentation

## 2013-10-02 DIAGNOSIS — M79604 Pain in right leg: Secondary | ICD-10-CM

## 2013-10-02 LAB — POC HEMOCCULT BLD/STL (OFFICE/1-CARD/DIAGNOSTIC): FECAL OCCULT BLD: NEGATIVE

## 2013-10-02 MED ORDER — NYSTATIN POWD: Status: DC

## 2013-10-02 MED ORDER — IBUPROFEN 800 MG PO TABS: ORAL_TABLET | ORAL | Status: DC

## 2013-10-02 MED ORDER — KETOROLAC TROMETHAMINE 60 MG/2ML IM SOLN
60.0000 mg | Freq: Once | INTRAMUSCULAR | Status: AC
  Administered 2013-10-02: 60 mg via INTRAMUSCULAR

## 2013-10-02 MED ORDER — AMLODIPINE BESYLATE 2.5 MG PO TABS: 2.5000 mg | ORAL_TABLET | Freq: Every day | ORAL | Status: DC

## 2013-10-02 MED ORDER — TERBINAFINE HCL 250 MG PO TABS: 250.0000 mg | ORAL_TABLET | Freq: Every day | ORAL | Status: DC

## 2013-10-02 NOTE — Progress Notes (Signed)
   Subjective:    Patient ID: Erika [name], female    DOB: [birth year], 78 y.o.   MRN: [id number]  HPI Patient is in for annual exam, pelvic and breast Health maintainance is reviewed and updated, specifically screening tests and recommended immunizations. She c/o left groin pain radiating from lower back in the past 3 weeks, feels she may have overdone her exercise , which she has worked hard on   And lost 20 pounds in the past 5 months. Denies incontinence of stool or urine, no lower extremity weakness or numbness  .       Review of Systems See HPI     Objective:   Physical Exam BP 148/70  Pulse 70  Resp 16  Wt 186 lb 12.8 oz (84.732 kg)  SpO2 100% Pleasant well nourished female, alert and oriented x 3, in no cardio-pulmonary distress. Afebrile. HEENT No facial trauma or asymetry. Sinuses non tender.  EOMI, PERTL, fundoscopic exami no hemorhage or exudate.  External ears normal, tympanic membranes clear. Oropharynx moist, no exudate,fairly  good dentition. Neck: supple, no adenopathy,JVD or thyromegaly.No bruits.  Chest: Clear to ascultation bilaterally.No crackles or wheezes. Non tender to palpation  Breast: No asymetry,no masses. No nipple discharge or inversion. No axillary or supraclavicular adenopathy  Cardiovascular system; Heart sounds normal,  S1 and  S2 ,no S3.  No murmur, or thrill. Apical beat not displaced Peripheral pulses normal.  Abdomen: Soft, non tender, no organomegaly or masses. No bruits. Bowel sounds normal. No guarding, tenderness or rebound.  Rectal:  No mass. Guaiac negative stool.  GU: External genitalia normal. No lesions. Vaginal canal normal.No discharge. Uterus absent , no adnexal masses, no   adnexal tenderness.  Musculoskeletal exam: Decreased though adequate  ROM of lumbar spine, full ROM hips , shoulders and knees. No deformity ,swelling or crepitus noted. No muscle wasting or atrophy.   Neurologic: Cranial  nerves 2 to 12 intact. Power, tone ,sensation and reflexes normal throughout. No disturbance in gait. No tremor.  Skin: Bilateral onychomycosis of toenails, with significant thickening and discoloration, also candidiasis under both breasts Pigmentation normal throughout  Psych; Normal mood and affect. Judgement and concentration normal        Assessment & Plan:   Routine general medical examination at a health care facility Annual exam as documented. Counseling done  re healthy lifestyle involving commitment to 150 minutes exercise per week, heart healthy diet, and attaining healthy weight.The importance of adequate sleep also discussed. Regular seat belt use and safe storage  of firearms if patient has them, is also discussed. Changes in health habits are decided on by the patient with goals and time frames  set for achieving them. Immunization and cancer screening needs are specifically addressed at this visit.   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 The importance of attaining a healthy weight is also discussed.

## 2013-10-02 NOTE — Assessment & Plan Note (Signed)
Annual exam as documented. Counseling done  re healthy lifestyle involving commitment to 150 minutes exercise per week, heart healthy diet, and attaining healthy weight.The importance of adequate sleep also discussed. Regular seat belt use and safe storage  of firearms if patient has them, is also discussed. Changes in health habits are decided on by the patient with goals and time frames  set for achieving them. Immunization and cancer screening needs are specifically addressed at this visit.  

## 2013-10-02 NOTE — Patient Instructions (Addendum)
F/u in 4 weeks, call if you need me before  BP is high  New for blood pressure is amlodipine , take every evening at the same time, around 9pm if that is your bedtime. Continue your other bP med as before  Toradol in office for right leg pain and ibuprofen is sent to the pharmacy of your choice  Powder for rash under breast sent to mail order  Antifungal tablets sent to mail order for fungal toenail infection  Congrats on EXCEL:LENT health improvement  You are referred for eye exam  CMP non fast in 4.5 month

## 2013-10-04 DIAGNOSIS — M79604 Pain in right leg: Secondary | ICD-10-CM | POA: Insufficient documentation

## 2013-10-04 NOTE — Assessment & Plan Note (Signed)
toradol administered in office

## 2013-10-04 NOTE — Assessment & Plan Note (Signed)
Topical medication prescribed, and pt counseled to keep area as dry as possible

## 2013-10-04 NOTE — Assessment & Plan Note (Signed)
Significant onychomycosis of toenails, antifungal tablet prescrinbed

## 2013-10-04 NOTE — Assessment & Plan Note (Signed)
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

## 2013-10-04 NOTE — Assessment & Plan Note (Signed)
Uncontrolled, amlodipine added at bedtime DASH diet and commitment to daily physical activity for a minimum of 30 minutes discussed and encouraged, as a part of hypertension management. The importance of attaining a healthy weight is also discussed.

## 2013-10-04 NOTE — Assessment & Plan Note (Signed)
Refer for eye exam

## 2013-10-06 LAB — CYTOLOGY - PAP

## 2013-10-13 ENCOUNTER — Encounter: Payer: Self-pay | Admitting: Family Medicine

## 2013-11-14 ENCOUNTER — Ambulatory Visit: Payer: Medicare Other | Admitting: Family Medicine

## 2013-12-03 ENCOUNTER — Telehealth: Payer: Self-pay

## 2013-12-03 NOTE — Telephone Encounter (Signed)
Since had not been taking the hyzaar which was controlling the Bp OK to be off the amlodipine, will re check at next viasit, needs to take med EVERY day at same time

## 2013-12-03 NOTE — Telephone Encounter (Signed)
Patient aware.  Discontinue note sent to [facility]

## 2014-02-06 LAB — COMPREHENSIVE METABOLIC PANEL
ALK PHOS: 67 U/L (ref 39–117)
ALT: 23 U/L (ref 0–35)
AST: 29 U/L (ref 0–37)
Albumin: 3.9 g/dL (ref 3.5–5.2)
BILIRUBIN TOTAL: 0.9 mg/dL (ref 0.2–1.2)
BUN: 22 mg/dL (ref 6–23)
CO2: 31 meq/L (ref 19–32)
CREATININE: 0.79 mg/dL (ref 0.50–1.10)
Calcium: 9.3 mg/dL (ref 8.4–10.5)
Chloride: 103 mEq/L (ref 96–112)
GLUCOSE: 93 mg/dL (ref 70–99)
Potassium: 4.1 mEq/L (ref 3.5–5.3)
SODIUM: 141 meq/L (ref 135–145)
Total Protein: 6.6 g/dL (ref 6.0–8.3)

## 2014-02-10 ENCOUNTER — Ambulatory Visit (INDEPENDENT_AMBULATORY_CARE_PROVIDER_SITE_OTHER): Payer: Medicare Other | Admitting: Family Medicine

## 2014-02-10 ENCOUNTER — Encounter (INDEPENDENT_AMBULATORY_CARE_PROVIDER_SITE_OTHER): Payer: Self-pay

## 2014-02-10 ENCOUNTER — Encounter: Payer: Self-pay | Admitting: Family Medicine

## 2014-02-10 VITALS — BP 134/74 | HR 86 | Resp 16 | Ht 68.0 in | Wt 196.0 lb

## 2014-02-10 DIAGNOSIS — I10 Essential (primary) hypertension: Secondary | ICD-10-CM

## 2014-02-10 DIAGNOSIS — E669 Obesity, unspecified: Secondary | ICD-10-CM

## 2014-02-10 DIAGNOSIS — J3089 Other allergic rhinitis: Secondary | ICD-10-CM

## 2014-02-10 DIAGNOSIS — Z23 Encounter for immunization: Secondary | ICD-10-CM

## 2014-02-10 DIAGNOSIS — R7301 Impaired fasting glucose: Secondary | ICD-10-CM

## 2014-02-10 NOTE — Patient Instructions (Signed)
Annual wellness exam April 2nd week, call if you need me before  Flu vaccine today  Please re commit to regular exercise and food portion control   I know you will get rid of the 10 pounds you have gained  No change in meds  Fasting lipid, chem 7 HBa1C first week in April

## 2014-02-12 ENCOUNTER — Telehealth: Payer: Self-pay | Admitting: *Deleted

## 2014-02-12 ENCOUNTER — Other Ambulatory Visit: Payer: Self-pay

## 2014-02-12 MED ORDER — LOSARTAN POTASSIUM-HCTZ 50-12.5 MG PO TABS: ORAL_TABLET | ORAL | Status: DC

## 2014-02-12 NOTE — Telephone Encounter (Signed)
Pt called requesting her BP medication to be refilled. Please advise [phone]

## 2014-02-12 NOTE — Telephone Encounter (Signed)
Refilled

## 2014-02-14 DIAGNOSIS — Z23 Encounter for immunization: Secondary | ICD-10-CM | POA: Insufficient documentation

## 2014-02-14 NOTE — Assessment & Plan Note (Signed)
Controlled, no change in medication DASH diet and commitment to daily physical activity for a minimum of 30 minutes discussed and encouraged, as a part of hypertension management. The importance of attaining a healthy weight is also discussed.  

## 2014-02-14 NOTE — Assessment & Plan Note (Signed)
Increased with season chgnage, but adequately controlled, no med chnage

## 2014-02-14 NOTE — Progress Notes (Signed)
   Subjective:    Patient ID: Erika [name], female    DOB: 07-23-1935, 78 y.o.   MRN: [id number]  HPI The PT is here for follow up and re-evaluation of chronic medical conditions, medication management and review of any available recent lab and radiology data.  Preventive health is updated, specifically  Cancer screening and Immunization.   Questions or concerns regarding consultations or procedures which the PT has had in the interim are  addressed. The PT denies any adverse reactions to current medications since the last visit.  There are no new concerns.  There are no specific complaints       Review of Systems See HPI Denies recent fever or chills. Denies sinus pressure, nasal congestion, ear pain or sore throat. Denies chest congestion, productive cough or wheezing. Denies chest pains, palpitations and leg swelling Denies abdominal pain, nausea, vomiting,diarrhea or constipation.   Denies dysuria, frequency, hesitancy or incontinence. Denies joint pain, swelling and limitation in mobility. Denies headaches, seizures, numbness, or tingling. Denies depression, anxiety or insomnia. Denies skin break down or rash.        Objective:   Physical Exam BP 134/74  Pulse 86  Resp 16  Ht 5\' 8"  (1.727 m)  Wt 196 lb (88.905 kg)  BMI 29.81 kg/m2  SpO2 97% Patient alert and oriented and in no cardiopulmonary distress.  HEENT: No facial asymmetry, EOMI,   oropharynx pink and moist.  Neck supple no JVD, no mass.  Chest: Clear to auscultation bilaterally.  CVS: S1, S2 no murmurs, no S3.Regular rate.  ABD: Soft non tender.   Ext: No edema  MS: Adequate ROM spine, shoulders, hips and knees.  Skin: Intact, no ulcerations or rash noted.  Psych: Good eye contact, normal affect. Memory intact not anxious or depressed appearing.  CNS: CN 2-12 intact, power,  normal throughout.no focal deficits noted.        Assessment & Plan:  Essential hypertension Controlled, no  change in medication DASH diet and commitment to daily physical activity for a minimum of 30 minutes discussed and encouraged, as a part of hypertension management. The importance of attaining a healthy weight is also discussed.   Allergic rhinitis Increased with season chgnage, but adequately controlled, no med chnage  IMPAIRED FASTING GLUCOSE Patient educated about the importance of limiting  Carbohydrate intake , the need to commit to daily physical activity for a minimum of 30 minutes , and to commit weight loss. The fact that changes in all these areas will reduce or eliminate all together the development of diabetes is stressed.     Obesity (BMI 30.0-34.9) Deteriorated. Patient re-educated about  the importance of commitment to a  minimum of 150 minutes of exercise per week. The importance of healthy food choices with portion control discussed. Encouraged to start a food diary, count calories and to consider  joining a support group. Sample diet sheets offered. Goals set by the patient for the next several months.     Need for prophylactic vaccination and inoculation against influenza Vaccine administered at visit.

## 2014-02-14 NOTE — Assessment & Plan Note (Signed)
Deteriorated. Patient re-educated about  the importance of commitment to a  minimum of 150 minutes of exercise per week. The importance of healthy food choices with portion control discussed. Encouraged to start a food diary, count calories and to consider  joining a support group. Sample diet sheets offered. Goals set by the patient for the next several months.    

## 2014-02-14 NOTE — Assessment & Plan Note (Signed)
Vaccine administered at visit.  

## 2014-02-14 NOTE — Assessment & Plan Note (Signed)
Patient educated about the importance of limiting  Carbohydrate intake , the need to commit to daily physical activity for a minimum of 30 minutes , and to commit weight loss. The fact that changes in all these areas will reduce or eliminate all together the development of diabetes is stressed.    

## 2014-05-04 ENCOUNTER — Other Ambulatory Visit: Payer: Self-pay

## 2014-05-04 ENCOUNTER — Telehealth: Payer: Self-pay | Admitting: Family Medicine

## 2014-05-04 MED ORDER — LOSARTAN POTASSIUM-HCTZ 50-12.5 MG PO TABS: ORAL_TABLET | ORAL | Status: DC

## 2014-05-04 NOTE — Telephone Encounter (Signed)
[facility]

## 2014-06-22 ENCOUNTER — Telehealth: Payer: Self-pay

## 2014-06-22 NOTE — Telephone Encounter (Signed)
Had an opening, tried to call patient back to come in at 2:15 (called back 4 times and it rang and rang) No option to leave message. When she calls back, if too late, I will document her symptoms and send to Dr

## 2014-06-24 NOTE — Telephone Encounter (Signed)
States she has gotten some medication and she is ok now

## 2014-07-30 DIAGNOSIS — R7301 Impaired fasting glucose: Secondary | ICD-10-CM | POA: Diagnosis not present

## 2014-07-30 DIAGNOSIS — I10 Essential (primary) hypertension: Secondary | ICD-10-CM | POA: Diagnosis not present

## 2014-07-30 LAB — HEMOGLOBIN A1C
HEMOGLOBIN A1C: 5.9 % — AB (ref <5.7)
Mean Plasma Glucose: 123 mg/dL — ABNORMAL HIGH (ref <117)

## 2014-07-31 LAB — LIPID PANEL
Cholesterol: 164 mg/dL (ref 0–200)
HDL: 54 mg/dL (ref >=46)
LDL CALC: 87 mg/dL (ref 0–99)
Total CHOL/HDL Ratio: 3 Ratio
Triglycerides: 114 mg/dL (ref <150)
VLDL: 23 mg/dL (ref 0–40)

## 2014-07-31 LAB — BASIC METABOLIC PANEL
BUN: 20 mg/dL (ref 6–23)
CALCIUM: 9.4 mg/dL (ref 8.4–10.5)
CO2: 29 mEq/L (ref 19–32)
CREATININE: 0.73 mg/dL (ref 0.50–1.10)
Chloride: 102 mEq/L (ref 96–112)
Glucose, Bld: 103 mg/dL — ABNORMAL HIGH (ref 70–99)
Potassium: 4.4 mEq/L (ref 3.5–5.3)
Sodium: 142 mEq/L (ref 135–145)

## 2014-08-04 ENCOUNTER — Ambulatory Visit (INDEPENDENT_AMBULATORY_CARE_PROVIDER_SITE_OTHER): Payer: Medicare Other | Admitting: Family Medicine

## 2014-08-04 ENCOUNTER — Encounter: Payer: Self-pay | Admitting: Family Medicine

## 2014-08-04 VITALS — BP 138/80 | HR 73 | Resp 16 | Ht 67.0 in | Wt 206.0 lb

## 2014-08-04 DIAGNOSIS — Z0000 Encounter for general adult medical examination without abnormal findings: Secondary | ICD-10-CM

## 2014-08-04 DIAGNOSIS — I10 Essential (primary) hypertension: Secondary | ICD-10-CM

## 2014-08-04 DIAGNOSIS — Z23 Encounter for immunization: Secondary | ICD-10-CM

## 2014-08-04 DIAGNOSIS — R7301 Impaired fasting glucose: Secondary | ICD-10-CM

## 2014-08-04 NOTE — Patient Instructions (Signed)
Annual physical exam in 4 months, call if you need me before  You need info re living will and this is provided  Prevnar today  You are referred to nutrtionist for help with weight management and blood sugar  Continue excellent exercise regime, and involvement with the [employer] , and [employer]   Non fasting, HBa1C, chem 7, CBC, TSH in 4 month

## 2014-08-04 NOTE — Progress Notes (Signed)
Subjective:    Patient ID: Erika [name], female    DOB: Jul 18, 1935, 79 y.o.   MRN: [id number]  HPI Preventive Screening-Counseling & Management   Patient present here today for a Medicare annual wellness visit.   Current Problems (verified)   Medications Prior to Visit Allergies (verified)   PAST HISTORY  Family History (verified)   Social History Widowed since 2005, has several children that she has helped raise but no biological. Retired from the school system    Risk Factors  Current exercise habits:  Tredmill at her home 20 mins several days a week and will start walking soon when the weather warms up   Dietary issues discussed: Heart healthy diet discussed- eating fruits and vegs and limits fried fatty foods    Cardiac risk factors: 1 sister and 1 brother with heart disease  Depression Screen  (Note: if answer to either of the following is "Yes", a more complete depression screening is indicated)   Over the past two weeks, have you felt down, depressed or hopeless? No  Over the past two weeks, have you felt little interest or pleasure in doing things? No  Have you lost interest or pleasure in daily life? No  Do you often feel hopeless? No  Do you cry easily over simple problems? No   Activities of Daily Living  In your present state of health, do you have any difficulty performing the following activities?  Driving?: No Managing money?: No Feeding yourself?:No Getting from bed to chair?:No Climbing a flight of stairs?:No Preparing food and eating?:No Bathing or showering?:No Getting dressed?:No Getting to the toilet?:No Using the toilet?:No Moving around from place to place?: No  Fall Risk Assessment In the past year have you fallen or had a near fall?:No Are you currently taking any medications that make you dizzy?:No   Hearing Difficulties: No Do you often ask people to speak up or repeat themselves?:No Do you experience ringing or noises in  your ears?:No Do you have difficulty understanding soft or whispered voices?:No  Cognitive Testing  Alert? Yes Normal Appearance?Yes  Oriented to person? Yes Place? Yes  Time? Yes  Displays appropriate judgment?Yes  Can read the correct time from a watch face? yes Are you having problems remembering things?No  Advanced Directives have been discussed with the patient?Yes, just completed a will , full code, needs to discuss living will and document with her niece   List the Names of Other Physician/Practitioners you currently use:  Dr Caprice Beaver (podiatrist)    Indicate any recent Medical Services you may have received from other than [facility] providers in the past year (date may be approximate).   Assessment:    Annual Wellness Exam   Plan:     Medicare Attestation  I have personally reviewed:  The patient's medical and social history  Their use of alcohol, tobacco or illicit drugs  Their current medications and supplements  The patient's functional ability including ADLs,fall risks, home safety risks, cognitive, and hearing and visual impairment  Diet and physical activities  Evidence for depression or mood disorders  The patient's weight, height, BMI, and visual acuity have been recorded in the chart. I have made referrals, counseling, and provided education to the patient based on review of the above and I have provided the patient with a written personalized care plan for preventive services.      Review of Systems     Objective:   Physical Exam  BP 138/80 mmHg  Pulse 73  Resp 16  Ht 5\' 7"  (1.702 m)  Wt 206 lb (93.441 kg)  BMI 32.26 kg/m2  SpO2 98%       Assessment & Plan:  Medicare annual wellness visit, subsequent Annual exam as documented. Counseling done  re healthy lifestyle involving commitment to 150 minutes exercise per week, heart healthy diet, and attaining healthy weight.The importance of adequate sleep also discussed. Regular seat belt use and home  safety, is also discussed. Changes in health habits are decided on by the patient with goals and time frames  set for achieving them. Immunization and cancer screening needs are specifically addressed at this visit.    Need for vaccination with 13-polyvalent pneumococcal conjugate vaccine After obtaining informed consent, the vaccine is  administered by LPN.

## 2014-08-04 NOTE — Assessment & Plan Note (Signed)

## 2014-08-04 NOTE — Assessment & Plan Note (Signed)
After obtaining informed consent, the vaccine is  administered by LPN.  

## 2014-08-24 ENCOUNTER — Other Ambulatory Visit: Payer: Self-pay | Admitting: Family Medicine

## 2014-08-24 DIAGNOSIS — Z1231 Encounter for screening mammogram for malignant neoplasm of breast: Secondary | ICD-10-CM

## 2014-09-17 ENCOUNTER — Ambulatory Visit (HOSPITAL_COMMUNITY): Payer: BLUE CROSS/BLUE SHIELD

## 2014-10-07 ENCOUNTER — Ambulatory Visit (HOSPITAL_COMMUNITY)
Admission: RE | Admit: 2014-10-07 | Discharge: 2014-10-07 | Disposition: A | Discharge status: Home / Self Care | Payer: Medicare Other | Source: Ambulatory Visit | Attending: Family Medicine | Admitting: Family Medicine

## 2014-10-07 DIAGNOSIS — Z1231 Encounter for screening mammogram for malignant neoplasm of breast: Secondary | ICD-10-CM | POA: Insufficient documentation

## 2014-10-12 ENCOUNTER — Other Ambulatory Visit: Payer: Self-pay | Admitting: Family Medicine

## 2014-10-12 ENCOUNTER — Telehealth: Payer: Self-pay

## 2014-10-12 MED ORDER — LOSARTAN POTASSIUM-HCTZ 50-12.5 MG PO TABS: ORAL_TABLET | ORAL | Status: DC

## 2014-10-12 NOTE — Telephone Encounter (Signed)
Med refill sent in.

## 2014-10-28 ENCOUNTER — Telehealth: Payer: Self-pay | Admitting: Family Medicine

## 2015-01-08 LAB — CBC WITH DIFFERENTIAL/PLATELET
Basophils Absolute: 0 K/uL (ref 0.0–0.1)
Basophils Relative: 0 % (ref 0–1)
Eosinophils Absolute: 0.1 K/uL (ref 0.0–0.7)
Eosinophils Relative: 2 % (ref 0–5)
HCT: 39.9 % (ref 36.0–46.0)
Hemoglobin: 13.4 g/dL (ref 12.0–15.0)
Lymphocytes Relative: 37 % (ref 12–46)
Lymphs Abs: 1.9 K/uL (ref 0.7–4.0)
MCH: 31.5 pg (ref 26.0–34.0)
MCHC: 33.6 g/dL (ref 30.0–36.0)
MCV: 93.7 fL (ref 78.0–100.0)
MPV: 11 fL (ref 8.6–12.4)
Monocytes Absolute: 0.4 K/uL (ref 0.1–1.0)
Monocytes Relative: 8 % (ref 3–12)
Neutro Abs: 2.7 K/uL (ref 1.7–7.7)
Neutrophils Relative %: 53 % (ref 43–77)
Platelets: 134 K/uL — ABNORMAL LOW (ref 150–400)
RBC: 4.26 MIL/uL (ref 3.87–5.11)
RDW: 13.7 % (ref 11.5–15.5)
WBC: 5 K/uL (ref 4.0–10.5)

## 2015-01-08 LAB — BASIC METABOLIC PANEL
BUN: 18 mg/dL (ref 7–25)
CO2: 31 mmol/L (ref 20–31)
Calcium: 9.2 mg/dL (ref 8.6–10.4)
Chloride: 102 mmol/L (ref 98–110)
Creat: 0.66 mg/dL (ref 0.60–0.93)
Glucose, Bld: 104 mg/dL — ABNORMAL HIGH (ref 65–99)
POTASSIUM: 4.2 mmol/L (ref 3.5–5.3)
SODIUM: 139 mmol/L (ref 135–146)

## 2015-01-08 LAB — HEMOGLOBIN A1C
HEMOGLOBIN A1C: 5.8 % — AB (ref <5.7)
MEAN PLASMA GLUCOSE: 120 mg/dL — AB (ref <117)

## 2015-01-08 LAB — TSH: TSH: 2.964 u[IU]/mL (ref 0.350–4.500)

## 2015-01-12 ENCOUNTER — Ambulatory Visit (INDEPENDENT_AMBULATORY_CARE_PROVIDER_SITE_OTHER): Payer: Medicare Other | Admitting: Family Medicine

## 2015-01-12 ENCOUNTER — Encounter: Payer: Self-pay | Admitting: Family Medicine

## 2015-01-12 VITALS — BP 138/78 | HR 62 | Resp 16 | Ht 67.0 in | Wt 203.0 lb

## 2015-01-12 DIAGNOSIS — Z1211 Encounter for screening for malignant neoplasm of colon: Secondary | ICD-10-CM

## 2015-01-12 DIAGNOSIS — R7301 Impaired fasting glucose: Secondary | ICD-10-CM | POA: Diagnosis not present

## 2015-01-12 DIAGNOSIS — M79673 Pain in unspecified foot: Secondary | ICD-10-CM | POA: Insufficient documentation

## 2015-01-12 DIAGNOSIS — J3089 Other allergic rhinitis: Secondary | ICD-10-CM

## 2015-01-12 DIAGNOSIS — M79672 Pain in left foot: Secondary | ICD-10-CM

## 2015-01-12 DIAGNOSIS — Z79899 Other long term (current) drug therapy: Secondary | ICD-10-CM

## 2015-01-12 DIAGNOSIS — Z13220 Encounter for screening for lipoid disorders: Secondary | ICD-10-CM

## 2015-01-12 DIAGNOSIS — I10 Essential (primary) hypertension: Secondary | ICD-10-CM | POA: Diagnosis not present

## 2015-01-12 DIAGNOSIS — Z23 Encounter for immunization: Secondary | ICD-10-CM

## 2015-01-12 DIAGNOSIS — E669 Obesity, unspecified: Secondary | ICD-10-CM

## 2015-01-12 LAB — POC HEMOCCULT BLD/STL (OFFICE/1-CARD/DIAGNOSTIC): Fecal Occult Blood, POC: NEGATIVE

## 2015-01-12 MED ORDER — IBUPROFEN 600 MG PO TABS: ORAL_TABLET | ORAL | Status: DC

## 2015-01-12 MED ORDER — PREDNISONE 5 MG PO TABS: 5.0000 mg | ORAL_TABLET | Freq: Two times a day (BID) | ORAL | Status: DC

## 2015-01-12 MED ORDER — PREDNISONE 5 MG PO TABS: 5.0000 mg | ORAL_TABLET | Freq: Two times a day (BID) | ORAL | Status: AC

## 2015-01-12 NOTE — Assessment & Plan Note (Signed)
3 week history c/w heel spur, trial of oral meds short course and heel cup

## 2015-01-12 NOTE — Assessment & Plan Note (Signed)
No current flare , uses zyrtec intermitently

## 2015-01-12 NOTE — Assessment & Plan Note (Addendum)
Improved.. Patient re-educated about  the importance of commitment to a  minimum of 150 minutes of exercise per week.  The importance of healthy food choices with portion control discussed. Encouraged to start a food diary, count calories and to consider  joining a support group. Sample diet sheets offered. Goals set by the patient for the next several months.   Weight /BMI 01/12/2015 08/04/2014 02/10/2014  WEIGHT 203 lb 206 lb 196 lb  HEIGHT 5\' 7"  5\' 7"  5\' 8"   BMI 31.79 kg/m2 32.26 kg/m2 29.81 kg/m2    Current exercise per week 90 minutes.
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
## 2015-01-12 NOTE — Assessment & Plan Note (Signed)
After obtaining informed consent, the vaccine is  administered by LPN.  

## 2015-01-12 NOTE — Assessment & Plan Note (Signed)
No mass, heme negative stool 

## 2015-01-12 NOTE — Assessment & Plan Note (Addendum)
Improved Patient educated about the importance of limiting  Carbohydrate intake , the need to commit to daily physical activity for a minimum of 30 minutes , and to commit weight loss. The fact that changes in all these areas will reduce or eliminate all together the development of diabetes is stressed.   Diabetic Labs Latest Ref Rng 01/07/2015 07/30/2014 02/06/2014 09/29/2013 06/03/2013  HbA1c <5.7 % 5.8(H) 5.9(H) - 5.4 5.8(H)  Microalbumin 0.00-1.89 mg/dL - - - - -  Micro/Creat Ratio 0.0-30.0 mg/g - - - - -  Chol 0 - 200 mg/dL - 164 - 152 -  HDL >=46 mg/dL - 54 - 49 -  Calc LDL 0 - 99 mg/dL - 87 - 79 -  Triglycerides <150 mg/dL - 114 - 119 -  Creatinine 0.60 - 0.93 mg/dL 0.66 0.73 0.79 0.62 -   BP/Weight 01/12/2015 08/04/2014 02/10/2014 10/02/2013 06/05/2013 0/73/7106 06/07/9483  Systolic BP 462 703 500 938 182 993 716  Diastolic BP 78 80 74 70 72 78 74  Wt. (Lbs) 203 206 196 186.8 206.8 207 198  BMI 31.79 32.26 29.81 28.81 31.89 31.92 30.54   Foot/eye exam completion dates 03/16/2010 02/11/2010  Eye Exam - normal  Foot exam Order yes -  Foot Form Completion - -

## 2015-01-12 NOTE — Assessment & Plan Note (Signed)
Controlled, no change in medication DASH diet and commitment to daily physical activity for a minimum of 30 minutes discussed and encouraged, as a part of hypertension management. The importance of attaining a healthy weight is also discussed.  BP/Weight 01/12/2015 08/04/2014 02/10/2014 10/02/2013 06/05/2013 0/51/8335 12/01/5187  Systolic BP 842 103 128 118 867 737 366  Diastolic BP 78 80 74 70 72 78 74  Wt. (Lbs) 203 206 196 186.8 206.8 207 198  BMI 31.79 32.26 29.81 28.81 31.89 31.92 30.54

## 2015-01-12 NOTE — Patient Instructions (Addendum)
F/u in 5 .5 months, call if you need me before  Improved blood suagr which is excellent   Please work on good  health habits so that your health will improve. 1. Commitment to daily physical activity for 30 to 60  minutes, if you are able to do this.  2. Commitment to wise food choices. Aim for half of your  food intake to be vegetable and fruit, one quarter starchy foods, and one quarter protein. Try to eat on a regular schedule  3 meals per day, snacking between meals should be limited to vegetables or fruits or small portions of nuts. 64 ounces of water per day is generally recommended, unless you have specific health conditions, like heart failure or kidney failure where you will need to limit fluid intake.  3. Commitment to sufficient and a  good quality of physical and mental rest daily, generally between 6 to 8 hours per day.  WITH PERSISTANCE AND PERSEVERANCE, THE IMPOSSIBLE , BECOMES THE NORM!  Left heel pain likely due to a spur, 2 meds prescribed short term use  Get "heel cup" at pharmacy to help with discomfort if needed  Flu vaccine and rectal exam today   CBC and diff, fasting lipid, cmp and EGFR, hBA1C in 5.5 month  .

## 2015-03-17 ENCOUNTER — Other Ambulatory Visit: Payer: Self-pay

## 2015-03-17 ENCOUNTER — Telehealth: Payer: Self-pay | Admitting: Family Medicine

## 2015-03-17 MED ORDER — LOSARTAN POTASSIUM-HCTZ 50-12.5 MG PO TABS: ORAL_TABLET | ORAL | Status: DC

## 2015-03-17 NOTE — Telephone Encounter (Signed)
Med refilled.

## 2015-03-17 NOTE — Telephone Encounter (Signed)
Patient is requesting a refill on losartan-hydrochlorothiazide (HYZAAR) 50-12.5 MG per tablet  Sent to [facility] in [place]

## 2015-06-14 ENCOUNTER — Ambulatory Visit: Payer: Medicare Other | Admitting: Family Medicine

## 2015-06-15 ENCOUNTER — Ambulatory Visit: Payer: Medicare Other | Admitting: Family Medicine

## 2015-06-22 LAB — CBC WITH DIFFERENTIAL/PLATELET
BASOS ABS: 0 10*3/uL (ref 0.0–0.1)
Basophils Relative: 0 % (ref 0–1)
EOS PCT: 2 % (ref 0–5)
Eosinophils Absolute: 0.1 10*3/uL (ref 0.0–0.7)
HEMATOCRIT: 39.5 % (ref 36.0–46.0)
Hemoglobin: 13.6 g/dL (ref 12.0–15.0)
LYMPHS ABS: 1.6 10*3/uL (ref 0.7–4.0)
LYMPHS PCT: 29 % (ref 12–46)
MCH: 31.9 pg (ref 26.0–34.0)
MCHC: 34.4 g/dL (ref 30.0–36.0)
MCV: 92.5 fL (ref 78.0–100.0)
MONOS PCT: 11 % (ref 3–12)
MPV: 10.5 fL (ref 8.6–12.4)
Monocytes Absolute: 0.6 10*3/uL (ref 0.1–1.0)
Neutro Abs: 3.1 10*3/uL (ref 1.7–7.7)
Neutrophils Relative %: 58 % (ref 43–77)
Platelets: 139 10*3/uL — ABNORMAL LOW (ref 150–400)
RBC: 4.27 MIL/uL (ref 3.87–5.11)
RDW: 13.6 % (ref 11.5–15.5)
WBC: 5.4 10*3/uL (ref 4.0–10.5)

## 2015-06-22 LAB — HEMOGLOBIN A1C
Hgb A1c MFr Bld: 5.9 % — ABNORMAL HIGH (ref <5.7)
Mean Plasma Glucose: 123 mg/dL — ABNORMAL HIGH (ref <117)

## 2015-06-23 LAB — LIPID PANEL
Cholesterol: 151 mg/dL (ref 125–200)
HDL: 51 mg/dL (ref >=46)
LDL CALC: 71 mg/dL (ref <130)
Total CHOL/HDL Ratio: 3 Ratio (ref <=5.0)
Triglycerides: 146 mg/dL (ref <150)
VLDL: 29 mg/dL (ref <30)

## 2015-06-23 LAB — COMPLETE METABOLIC PANEL WITH GFR
ALT: 24 U/L (ref 6–29)
AST: 24 U/L (ref 10–35)
Albumin: 3.7 g/dL (ref 3.6–5.1)
Alkaline Phosphatase: 64 U/L (ref 33–130)
BILIRUBIN TOTAL: 0.7 mg/dL (ref 0.2–1.2)
BUN: 21 mg/dL (ref 7–25)
CO2: 29 mmol/L (ref 20–31)
Calcium: 9.2 mg/dL (ref 8.6–10.4)
Chloride: 103 mmol/L (ref 98–110)
Creat: 0.74 mg/dL (ref 0.60–0.93)
GFR, Est African American: >89 mL/min (ref >=60)
GFR, Est Non African American: 78 mL/min (ref >=60)
GLUCOSE: 119 mg/dL — AB (ref 65–99)
POTASSIUM: 3.8 mmol/L (ref 3.5–5.3)
SODIUM: 141 mmol/L (ref 135–146)
TOTAL PROTEIN: 6.4 g/dL (ref 6.1–8.1)

## 2015-06-25 ENCOUNTER — Encounter: Payer: Self-pay | Admitting: Family Medicine

## 2015-06-25 ENCOUNTER — Ambulatory Visit (INDEPENDENT_AMBULATORY_CARE_PROVIDER_SITE_OTHER): Payer: Medicare Other | Admitting: Family Medicine

## 2015-06-25 VITALS — BP 130/80 | HR 72 | Resp 16 | Ht 67.0 in | Wt 216.0 lb

## 2015-06-25 DIAGNOSIS — J3089 Other allergic rhinitis: Secondary | ICD-10-CM | POA: Diagnosis not present

## 2015-06-25 DIAGNOSIS — E669 Obesity, unspecified: Secondary | ICD-10-CM

## 2015-06-25 DIAGNOSIS — M79672 Pain in left foot: Secondary | ICD-10-CM

## 2015-06-25 DIAGNOSIS — R7301 Impaired fasting glucose: Secondary | ICD-10-CM

## 2015-06-25 DIAGNOSIS — I10 Essential (primary) hypertension: Secondary | ICD-10-CM | POA: Diagnosis not present

## 2015-06-25 NOTE — Progress Notes (Signed)
Subjective:    Patient ID: Erika [name], female    DOB: July 29, 1935, 80 y.o.   MRN: [id number]  HPI   Erika [name]     MRN: [id number]      DOB: Oct 23, 1935   HPI Erika [name] is here for follow up and re-evaluation of chronic medical conditions, medication management and review of any available recent lab and radiology data.  Preventive health is updated, specifically  Cancer screening and Immunization.   C/o weight gain with no regular exercise and poor food choice, states she will change this The PT denies any adverse reactions to current medications since the last visit.    ROS Denies recent fever or chills. Denies sinus pressure, nasal congestion, ear pain or sore throat. Denies chest congestion, productive cough or wheezing. Denies chest pains, palpitations and leg swelling Denies abdominal pain, nausea, vomiting,diarrhea or constipation.   Denies dysuria, frequency, hesitancy or incontinence. Denies joint pain, swelling and limitation in mobility. Denies headaches, seizures, numbness, or tingling. Denies depression, anxiety or insomnia. Denies skin break down or rash.   PE  BP 130/80 mmHg  Pulse 72  Resp 16  Ht 5\' 7"  (1.702 m)  Wt 216 lb (97.977 kg)  BMI 33.82 kg/m2  SpO2 98%  Patient alert and oriented and in no cardiopulmonary distress.  HEENT: No facial asymmetry, EOMI,   oropharynx pink and moist.  Neck supple no JVD, no mass.  Chest: Clear to auscultation bilaterally.  CVS: S1, S2 no murmurs, no S3.Regular rate.  ABD: Soft non tender.   Ext: No edema  MS: Adequate ROM spine, shoulders, hips and knees.  Skin: Intact, no ulcerations or rash noted.  Psych: Good eye contact, normal affect. Memory intact not anxious or depressed appearing.  CNS: CN 2-12 intact, power,  normal throughout.no focal deficits noted.   Assessment & Plan   Essential hypertension Controlled, no change in medication DASH diet and commitment to daily physical  activity for a minimum of 30 minutes discussed and encouraged, as a part of hypertension management. The importance of attaining a healthy weight is also discussed.  BP/Weight 06/25/2015 01/12/2015 08/04/2014 02/10/2014 10/02/2013 06/05/2013 123456  Systolic BP [id number] 0000000 0000000 Q000111Q 123456 [id number]  Diastolic BP 80 78 80 74 70 72 78  Wt. (Lbs) 216 203 206 196 186.8 206.8 207  BMI 33.82 31.79 32.26 29.81 28.81 31.89 31.92        Allergic rhinitis No current symptoms, will use med as needed  Obesity (BMI 30.0-34.9) Deteriorated. Patient re-educated about  the importance of commitment to a  minimum of 150 minutes of exercise per week.  The importance of healthy food choices with portion control discussed. Encouraged to start a food diary, count calories and to consider  joining a support group. Sample diet sheets offered. Goals set by the patient for the next several months.   Weight /BMI 06/25/2015 01/12/2015 08/04/2014  WEIGHT 216 lb 203 lb 206 lb  HEIGHT 5\' 7"  5\' 7"  5\' 7"   BMI 33.82 kg/m2 31.79 kg/m2 32.26 kg/m2    Current exercise per week 60 minutes.   Pain in heel Reports improvement with stretching exercises, now able to commit to regular exercise  IMPAIRED FASTING GLUCOSE Deteriorated Patient educated about the importance of limiting  Carbohydrate intake , the need to commit to daily physical activity for a minimum of 30 minutes , and to commit weight loss. The fact that changes in all these areas will reduce or eliminate all together the  development of diabetes is stressed.   Diabetic Labs Latest Ref Rng 06/22/2015 01/07/2015 07/30/2014 02/06/2014 09/29/2013  HbA1c <5.7 % 5.9(H) 5.8(H) 5.9(H) - 5.4  Chol 125 - 200 mg/dL 151 - 164 - 152  HDL >=46 mg/dL 51 - 54 - 49  Calc LDL <130 mg/dL 71 - 87 - 79  Triglycerides <150 mg/dL 146 - 114 - 119  Creatinine 0.60 - 0.93 mg/dL 0.74 0.66 0.73 0.79 0.62   BP/Weight 06/25/2015 01/12/2015 08/04/2014 02/10/2014 10/02/2013 06/05/2013 123456  Systolic BP  [id number] 0000000 0000000 Q000111Q 123456 [id number]  Diastolic BP 80 78 80 74 70 72 78  Wt. (Lbs) 216 203 206 196 186.8 206.8 207  BMI 33.82 31.79 32.26 29.81 28.81 31.89 31.92   Foot/eye exam completion dates 03/16/2010 02/11/2010  Eye Exam - normal  Foot exam Order yes -  Foot Form Completion - -            Review of Systems     Objective:   Physical Exam        Assessment & Plan:

## 2015-06-25 NOTE — Patient Instructions (Addendum)
Annual wellness in 4 month, call if you need me sooner   Weight has increased by 13 pounds, this NEEDS to go!  BP is good and labs are excellent, except blood sugar is up  It is important that you exercise regularly at least 30 minutes 7 times a week. If you develop chest pain, have severe difficulty breathing, or feel very tired, stop exercising immediately and seek medical attention   Thanks for choosing [facility], we consider it a privelige to serve you.

## 2015-06-26 NOTE — Assessment & Plan Note (Signed)
Deteriorated Patient educated about the importance of limiting  Carbohydrate intake , the need to commit to daily physical activity for a minimum of 30 minutes , and to commit weight loss. The fact that changes in all these areas will reduce or eliminate all together the development of diabetes is stressed.   Diabetic Labs Latest Ref Rng 06/22/2015 01/07/2015 07/30/2014 02/06/2014 09/29/2013  HbA1c <5.7 % 5.9(H) 5.8(H) 5.9(H) - 5.4  Chol 125 - 200 mg/dL 151 - 164 - 152  HDL >=46 mg/dL 51 - 54 - 49  Calc LDL <130 mg/dL 71 - 87 - 79  Triglycerides <150 mg/dL 146 - 114 - 119  Creatinine 0.60 - 0.93 mg/dL 0.74 0.66 0.73 0.79 0.62   BP/Weight 06/25/2015 01/12/2015 08/04/2014 02/10/2014 10/02/2013 06/05/2013 123456  Systolic BP [id number] 0000000 0000000 Q000111Q 123456 [id number]  Diastolic BP 80 78 80 74 70 72 78  Wt. (Lbs) 216 203 206 196 186.8 206.8 207  BMI 33.82 31.79 32.26 29.81 28.81 31.89 31.92   Foot/eye exam completion dates 03/16/2010 02/11/2010  Eye Exam - normal  Foot exam Order yes -  Foot Form Completion - -

## 2015-06-26 NOTE — Assessment & Plan Note (Signed)
Deteriorated. Patient re-educated about  the importance of commitment to a  minimum of 150 minutes of exercise per week.  The importance of healthy food choices with portion control discussed. Encouraged to start a food diary, count calories and to consider  joining a support group. Sample diet sheets offered. Goals set by the patient for the next several months.   Weight /BMI 06/25/2015 01/12/2015 08/04/2014  WEIGHT 216 lb 203 lb 206 lb  HEIGHT 5\' 7"  5\' 7"  5\' 7"   BMI 33.82 kg/m2 31.79 kg/m2 32.26 kg/m2    Current exercise per week 60 minutes.

## 2015-06-26 NOTE — Assessment & Plan Note (Signed)
Reports improvement with stretching exercises, now able to commit to regular exercise

## 2015-06-26 NOTE — Assessment & Plan Note (Signed)
No current symptoms, will use med as needed

## 2015-06-26 NOTE — Assessment & Plan Note (Signed)
Controlled, no change in medication DASH diet and commitment to daily physical activity for a minimum of 30 minutes discussed and encouraged, as a part of hypertension management. The importance of attaining a healthy weight is also discussed.  BP/Weight 06/25/2015 01/12/2015 08/04/2014 02/10/2014 10/02/2013 06/05/2013 123456  Systolic BP [id number] 0000000 0000000 Q000111Q 123456 [id number]  Diastolic BP 80 78 80 74 70 72 78  Wt. (Lbs) 216 203 206 196 186.8 206.8 207  BMI 33.82 31.79 32.26 29.81 28.81 31.89 31.92

## 2015-09-08 ENCOUNTER — Other Ambulatory Visit: Payer: Self-pay | Admitting: Family Medicine

## 2015-09-08 DIAGNOSIS — Z1231 Encounter for screening mammogram for malignant neoplasm of breast: Secondary | ICD-10-CM

## 2015-09-12 ENCOUNTER — Other Ambulatory Visit: Payer: Self-pay | Admitting: Family Medicine

## 2015-09-16 ENCOUNTER — Ambulatory Visit: Payer: Medicare Other

## 2015-09-16 VITALS — BP 124/80

## 2015-09-16 DIAGNOSIS — I10 Essential (primary) hypertension: Secondary | ICD-10-CM

## 2015-09-16 NOTE — Progress Notes (Signed)
Patient in for random blood pressure check.  States that [facility] and [facility] was [facility].  Blood pressure checked manually and within normal range.  Will keep next appointment in July and will go and have blood pressure checked at pharmacy at least once weekly and will call with abnormal readings.

## 2015-10-11 ENCOUNTER — Telehealth: Payer: Self-pay | Admitting: Family Medicine

## 2015-10-11 NOTE — Telephone Encounter (Signed)
C/o  Small and hard bowel movement in past week, start daily stool softener, colace  One twice daily, drink 64 ounces water daily, start raisin bran every morning, eat a lot of vegetable and fruit, 3 warm prunes every day, spoke with pt she knows to call for appt if not  better

## 2015-10-11 NOTE — Telephone Encounter (Signed)
Mrs. Michele is asking if Dr. 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

## 2015-10-20 ENCOUNTER — Ambulatory Visit (HOSPITAL_COMMUNITY)
Admission: RE | Admit: 2015-10-20 | Discharge: 2015-10-20 | Disposition: A | Discharge status: Home / Self Care | Payer: Medicare Other | Source: Ambulatory Visit | Attending: Family Medicine | Admitting: Family Medicine

## 2015-10-20 DIAGNOSIS — Z1231 Encounter for screening mammogram for malignant neoplasm of breast: Secondary | ICD-10-CM | POA: Insufficient documentation

## 2015-10-21 ENCOUNTER — Encounter: Payer: Self-pay | Admitting: Family Medicine

## 2015-10-21 ENCOUNTER — Ambulatory Visit (INDEPENDENT_AMBULATORY_CARE_PROVIDER_SITE_OTHER): Payer: Medicare Other | Admitting: Family Medicine

## 2015-10-21 VITALS — BP 140/74 | HR 64 | Resp 18 | Ht 67.0 in | Wt 210.0 lb

## 2015-10-21 DIAGNOSIS — F5102 Adjustment insomnia: Secondary | ICD-10-CM | POA: Diagnosis not present

## 2015-10-21 DIAGNOSIS — I10 Essential (primary) hypertension: Secondary | ICD-10-CM

## 2015-10-21 DIAGNOSIS — R194 Change in bowel habit: Secondary | ICD-10-CM | POA: Diagnosis not present

## 2015-10-21 DIAGNOSIS — R140 Abdominal distension (gaseous): Secondary | ICD-10-CM

## 2015-10-21 DIAGNOSIS — E559 Vitamin D deficiency, unspecified: Secondary | ICD-10-CM

## 2015-10-21 DIAGNOSIS — K5909 Other constipation: Secondary | ICD-10-CM

## 2015-10-21 DIAGNOSIS — R7301 Impaired fasting glucose: Secondary | ICD-10-CM

## 2015-10-21 DIAGNOSIS — R1013 Epigastric pain: Secondary | ICD-10-CM

## 2015-10-21 MED ORDER — MECLIZINE HCL 12.5 MG PO TABS: ORAL_TABLET | ORAL | Status: DC

## 2015-10-21 MED ORDER — TEMAZEPAM 7.5 MG PO CAPS
7.5000 mg | ORAL_CAPSULE | Freq: Every evening | ORAL | Status: DC | PRN
Start: 2015-10-21 — End: 2015-11-16

## 2015-10-21 NOTE — Patient Instructions (Addendum)
Keep appt as before  You are referred  To Dr Sydell Axon for evaluation of bowels  Breath test today please  New medication for sleep

## 2015-10-22 DIAGNOSIS — R194 Change in bowel habit: Secondary | ICD-10-CM | POA: Insufficient documentation

## 2015-10-22 DIAGNOSIS — R140 Abdominal distension (gaseous): Secondary | ICD-10-CM | POA: Insufficient documentation

## 2015-10-22 NOTE — Assessment & Plan Note (Signed)
Elevated systolic pressure , however pt is anxious, sleeep deprived, no med change at this time DASH diet and commitment to daily physical activity for a minimum of 30 minutes discussed and encouraged, as a part of hypertension management. The importance of attaining a healthy weight is also discussed.  BP/Weight 10/21/2015 09/16/2015 06/25/2015 01/12/2015 08/04/2014 [id number] Q000111Q  Systolic BP [id number] [id number] 0000000 0000000 Q000111Q 123456  Diastolic BP 74 80 80 78 80 74 70  Wt. (Lbs) 210 - 216 203 206 196 186.8  BMI 32.88 - 33.82 31.79 32.26 29.81 28.81

## 2015-10-22 NOTE — Assessment & Plan Note (Signed)
Sleep hygiene reviewed and written information offered also. Prescription sent for  medication needed.  

## 2015-10-22 NOTE — Progress Notes (Signed)
   Erika [name]     MRN: [id number]      DOB: [birth year]   HPI Erika [name] is here anxious and concerned about her health. C/o hard stool and "balls" worse in the past 1 week, but not an entirely new complaint . She also c/o belching and "tasting the food " in her mouth after she eats, and some substernal discomfort .She denies abdominal pain, black or bloody stool She had  spoken directly with me a few weeks ago, states initially the colace helped a lot but no more. She is very worried that she is ill, and is not sleeping as she should. Today her systolic blood pressure is mildly elevated and she is anxious and seemingly disappointed that her medication is not being changed C/o dizziness in past week , and left ear pain. Denies fever, chills , nasal drainage or sinus pressure or sore throat. Denies cough  ROS Denies recent fever or chills. Denies sinus pressure, nasal congestion,  or sore throat. Denies chest congestion, productive cough or wheezing. Denies chest pains, palpitations and leg swelling .   Denies dysuria, frequency, hesitancy or incontinence. Denies uncontrolled  joint pain, swelling and limitation in mobility. Denies headaches, seizures, numbness, or tingling. . Denies skin break down or rash.   PE  BP 140/74 mmHg  Pulse 64  Resp 18  Ht 5\' 7"  (1.702 m)  Wt 210 lb (95.255 kg)  BMI 32.88 kg/m2  SpO2 98%  Patient alert and oriented and in no cardiopulmonary distress.  HEENT: No facial asymmetry, EOMI,   oropharynx pink and moist.  Neck supple no JVD, no mass.  Chest: Clear to auscultation bilaterally.  CVS: S1, S2 no murmurs, no S3.Regular rate.  ABD: Soft non tender. No organomegaly or mass, normal BS Rectal: deferred  Ext: No edema  MS: Adequate though reduced ROM spine, shoulders, hips and knees.  Skin: Intact, no ulcerations or rash noted.  Psych: Good eye contact, normal affect. Memory intact not anxious or depressed appearing.  CNS: CN  2-12 intact, power,  normal throughout.no focal deficits noted.   Assessment & Plan   Essential hypertension Elevated systolic pressure , however pt is anxious, sleeep deprived, no med change at this time DASH diet and commitment to daily physical activity for a minimum of 30 minutes discussed and encouraged, as a part of hypertension management. The importance of attaining a healthy weight is also discussed.  BP/Weight 10/21/2015 09/16/2015 06/25/2015 01/12/2015 08/04/2014 A999333 Q000111Q  Systolic BP [id number] A999333 [id number] 0000000 0000000 Q000111Q 123456  Diastolic BP 74 80 80 78 80 74 70  Wt. (Lbs) 210 - 216 203 206 196 186.8  BMI 32.88 - 33.82 31.79 32.26 29.81 28.81        Insomnia due to stress Sleep hygiene reviewed and written information offered also. Prescription sent for  medication needed.   Change in stool habits 1 month h/o intermittent  hard stool, "balls" and constipation, though pt has experienced this in the past, her symptoms have become worrisome to her and she needs GI evaluation, will appraise her Doc of this and also refer   Flatulence/gas pain/belching 1 month h/o increased gas pain , belching and gas, needs breath test and GI eval

## 2015-10-22 NOTE — Assessment & Plan Note (Signed)
1 month h/o increased gas pain , belching and gas, needs breath test and GI eval

## 2015-10-22 NOTE — Assessment & Plan Note (Signed)
1 month h/o intermittent  hard stool, "balls" and constipation, though pt has experienced this in the past, her symptoms have become worrisome to her and she needs GI evaluation, will appraise her Doc of this and also refer

## 2015-10-23 LAB — CBC
HCT: 40.5 % (ref 35.0–45.0)
Hemoglobin: 13.7 g/dL (ref 11.7–15.5)
MCH: 31.8 pg (ref 27.0–33.0)
MCHC: 33.8 g/dL (ref 32.0–36.0)
MCV: 94 fL (ref 80.0–100.0)
MPV: 10.2 fL (ref 7.5–12.5)
PLATELETS: 152 10*3/uL (ref 140–400)
RBC: 4.31 MIL/uL (ref 3.80–5.10)
RDW: 13.6 % (ref 11.0–15.0)
WBC: 6.1 10*3/uL (ref 3.8–10.8)

## 2015-10-23 LAB — COMPREHENSIVE METABOLIC PANEL
ALT: 22 U/L (ref 6–29)
AST: 19 U/L (ref 10–35)
Albumin: 4 g/dL (ref 3.6–5.1)
Alkaline Phosphatase: 67 U/L (ref 33–130)
BUN: 21 mg/dL (ref 7–25)
CO2: 29 mmol/L (ref 20–31)
Calcium: 9.2 mg/dL (ref 8.6–10.4)
Chloride: 101 mmol/L (ref 98–110)
Creat: 0.75 mg/dL (ref 0.60–0.93)
GLUCOSE: 120 mg/dL — AB (ref 65–99)
POTASSIUM: 4.2 mmol/L (ref 3.5–5.3)
Sodium: 137 mmol/L (ref 135–146)
Total Bilirubin: 0.6 mg/dL (ref 0.2–1.2)
Total Protein: 6.4 g/dL (ref 6.1–8.1)

## 2015-10-24 LAB — HEMOGLOBIN A1C
Hgb A1c MFr Bld: 5.7 % — ABNORMAL HIGH (ref <5.7)
Mean Plasma Glucose: 117 mg/dL

## 2015-10-25 LAB — H. PYLORI BREATH TEST: H. PYLORI BREATH TEST: DETECTED — AB

## 2015-10-25 LAB — VITAMIN D 25 HYDROXY (VIT D DEFICIENCY, FRACTURES): Vit D, 25-Hydroxy: 36 ng/mL (ref 30–100)

## 2015-10-26 ENCOUNTER — Encounter: Payer: Self-pay | Admitting: Nurse Practitioner

## 2015-10-26 ENCOUNTER — Ambulatory Visit (INDEPENDENT_AMBULATORY_CARE_PROVIDER_SITE_OTHER): Payer: Medicare Other | Admitting: Nurse Practitioner

## 2015-10-26 VITALS — BP 156/64 | HR 76 | Temp 97.5°F | Ht 67.0 in | Wt 210.4 lb

## 2015-10-26 DIAGNOSIS — K5900 Constipation, unspecified: Secondary | ICD-10-CM | POA: Diagnosis not present

## 2015-10-26 NOTE — Progress Notes (Signed)
Primary Care Physician:  Tula Nakayama, MD Primary Gastroenterologist:  Dr. Gala Romney  Chief Complaint  Patient presents with  . Constipation    HPI:   Erika [name] is a 80 y.o. female who presents on referral from primary care for dyspepsia, change in stool habits, constipation, flatulence/belching. She was last seen by primary care 10/21/2015 at that time noted hard stools worse in the past week which is very new for her, dyspepsia type symptoms without abdominal pain black or bloody stools, Colace initially helped but became ineffective after a week. She was subsequently referred to GI. Her only encounter with [place] was in a been accessed colonoscopy which was completed on 09/22/2010 and found normal colon, normal rectum. Recommended repeat colonoscopy in 10 years.  Today she states she's having constipation. Has a chronic history of constipation since childhood. Began getting worse about 3 weeks ago. Stools are hard "little balls" which requires straining. Tried an enema which produced small ball-like hard stools. Has to take a laxative to have a bowel movement. Currently takes colace daily and has to take Dulcolax to have a bowel movement. Stools are softening a bit in the past couple days. Denies hematochezia, melena. Only abdominal pain is lower abdomen when straining, N/V. Is having more problems with belching "and I can taste the food I ate a day ago." Is not on a PPI. Denies dysphagia. Denies chest pain, dyspnea, dizziness, lightheadedness, syncope, near syncope. Denies any other upper or lower GI symptoms.  Past Medical History  Diagnosis Date  . Obesity, mild   . Hypertension   . Diabetes mellitus, type 2 ([place])     normglycmic off meds since approx 2010    Past Surgical History  Procedure Laterality Date  . Total abdominal hysterectomy w/ bilateral salpingoophorectomy      Current Outpatient Prescriptions  Medication Sig Dispense Refill  . aspirin (ANACIN) 81 MG EC tablet  Take 81 mg by mouth daily. Take one tablet by mouth daily       . bisacodyl (DULCOLAX) 5 MG EC tablet Take 5 mg by mouth as needed for moderate constipation.    . Calcium Carbonate-Vitamin D (CALCIUM 600/VITAMIN D) 600-400 MG-UNIT per chew tablet Chew 1 tablet by mouth daily. Take one tablet by mouth two times a day     . cetirizine (ZYRTEC HIVES RELIEF) 10 MG tablet Take 10 mg by mouth daily.      Marland Kitchen docusate sodium (COLACE) 100 MG capsule Take 100 mg by mouth 2 (two) times daily.    Marland Kitchen losartan-hydrochlorothiazide (HYZAAR) 50-12.5 MG tablet TAKE 1 TABLET BY MOUTH DAILY. 90 tablet 0  . meclizine (ANTIVERT) 12.5 MG tablet One tablet twice daily as needed fior dizziness 20 tablet 0  . Multiple Vitamins-Minerals (CENTRUM SILVER) tablet Take 1 tablet by mouth daily. One tablet by mouth once daily     . Omega-3 Fatty Acids (FISH OIL) 1000 MG CAPS Take 1 capsule by mouth daily.    . temazepam (RESTORIL) 7.5 MG capsule Take 1 capsule (7.5 mg total) by mouth at bedtime as needed for sleep. 30 capsule 0   No current facility-administered medications for this visit.    Allergies as of 10/26/2015 - Review Complete 10/26/2015  Allergen Reaction Noted  . Benazepril-hydrochlorothiazide  03/16/2009  . Gabapentin Itching 08/03/2010    Family History  Problem Relation Age of Onset  . Cancer Mother 52    cervical   . Diabetes Sister   . Diabetes Sister   .  Diabetes Sister   . Diabetes Sister   . Heart disease Sister   . Diabetes Brother   . Diabetes Brother   . Heart disease Brother   . Diabetes Brother   . Diabetes Brother   . Diabetes Brother   . Diabetes Sister   . Diabetes Sister   . Diabetes Sister     Social History   Social History  . Marital Status: Widowed    Spouse Name: N/A  . Number of Children: N/A  . Years of Education: N/A   Occupational History  . Not on file.   Social History Main Topics  . Smoking status: Never Smoker   . Smokeless tobacco: Not on file      Comment: Never smoked  . Alcohol Use: No  . Drug Use: No  . Sexual Activity: Not Currently   Other Topics Concern  . Not on file   Social History Narrative    Review of Systems: General: Negative for anorexia, weight loss, fever, chills, fatigue, weakness. ENT: Negative for hoarseness, difficulty swallowing. CV: Negative for chest pain, angina, palpitations, peripheral edema.  Respiratory: Negative for dyspnea at rest, cough, sputum, wheezing.  GI: See history of present illness. MS: Negative for joint pain, low back pain.  Derm: Negative for rash or itching.  Endo: Negative for unusual weight change.  Heme: Negative for bruising or bleeding. Allergy: Negative for rash or hives.    Physical Exam: BP 156/64 mmHg  Pulse 76  Temp(Src) 97.5 F (36.4 C) (Oral)  Ht 5\' 7"  (1.702 m)  Wt 210 lb 6.4 oz (95.437 kg)  BMI 32.95 kg/m2 General:   Alert and oriented. Pleasant and cooperative. Well-nourished and well-developed.  Head:  Normocephalic and atraumatic. Eyes:  Without icterus, sclera clear and conjunctiva pink.  Ears:  Normal auditory acuity. Cardiovascular:  S1, S2 present without murmurs appreciated. Extremities without clubbing or edema. Respiratory:  Clear to auscultation bilaterally. No wheezes, rales, or rhonchi. No distress.  Gastrointestinal:  +BS, soft, non-tender and non-distended. No HSM noted. No guarding or rebound. No masses appreciated.  Rectal:  Deferred  Musculoskalatal:  Symmetrical without gross deformities. Skin:  Intact without significant lesions or rashes. Neurologic:  Alert and oriented x4;  grossly normal neurologically. Psych:  Alert and cooperative. Normal mood and affect. Heme/Lymph/Immune: No excessive bruising noted.    10/26/2015 11:15 AM   Disclaimer: This note was dictated with voice recognition software. Similar sounding words can inadvertently be transcribed and may not be corrected upon review.

## 2015-10-26 NOTE — Assessment & Plan Note (Signed)
Noted chronic constipation since childhood. This is become worse in the past 3 weeks. She is currently taking Colace stool softener daily and still requires Dulcolax in order to have a bowel movement. She also one point required giving herself an enema. Stools are hard, significant straining. No overt abdominal pain noted other than lower abdominal muscle pain when straining. No blood noted in her stools, no other red flag/warning signs or symptoms. Colonoscopy is up-to-date 5 years ago recommended repeat in 5 more years (2022). At this point I will start her on Linzess 72 g once a day on an empty stomach for chronic idiopathic constipation. I'll also do an abdominal x-ray flat plate to check for excessive stool burden. She is to call some 2 weeks with a progress report on her Linzess. Return for follow-up in 3 months.

## 2015-10-26 NOTE — Progress Notes (Signed)
cc'ed to pcp °

## 2015-10-26 NOTE — Patient Instructions (Signed)
1. We will start you on Linzess 72 g pills. Take this one a day, on an empty stomach. 2. Call us in 2 weeks and let us know how you're doing with the medication. 3. Return for follow-up in 3 months.

## 2015-10-27 MED ORDER — AMOXICILL-CLARITHRO-LANSOPRAZ PO MISC: Freq: Two times a day (BID) | ORAL | Status: DC

## 2015-10-27 NOTE — Addendum Note (Signed)
Addended by: Eual Fines on: 10/27/2015 09:43 AM   Modules accepted: Orders

## 2015-11-09 ENCOUNTER — Telehealth: Payer: Self-pay | Admitting: Internal Medicine

## 2015-11-09 NOTE — Telephone Encounter (Signed)
Routing to EG 

## 2015-11-09 NOTE — Telephone Encounter (Signed)
Pt was seen in June by EG and was calling to let us know that she is doing better and feeling well.

## 2015-11-11 NOTE — Telephone Encounter (Signed)
Noted, great to hear!

## 2015-11-16 ENCOUNTER — Encounter: Payer: Self-pay | Admitting: Family Medicine

## 2015-11-16 ENCOUNTER — Ambulatory Visit (INDEPENDENT_AMBULATORY_CARE_PROVIDER_SITE_OTHER): Payer: Medicare Other | Admitting: Family Medicine

## 2015-11-16 VITALS — BP 110/70 | HR 66 | Resp 18 | Ht 67.0 in | Wt 210.0 lb

## 2015-11-16 DIAGNOSIS — Z0000 Encounter for general adult medical examination without abnormal findings: Secondary | ICD-10-CM

## 2015-11-16 DIAGNOSIS — I10 Essential (primary) hypertension: Secondary | ICD-10-CM

## 2015-11-16 DIAGNOSIS — R7301 Impaired fasting glucose: Secondary | ICD-10-CM

## 2015-11-16 NOTE — Progress Notes (Signed)
Preventive Screening-Counseling & Management   Patient present here today for a Medicare annual wellness visit.   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 No  Over the past two weeks, have you felt little interest or pleasure in doing things? No  Have you lost interest or pleasure in daily life? No  Do you often feel hopeless? No  Do you cry easily over simple problems? No   Activities of Daily Living  In your present state of health, do you have any difficulty performing the following activities?  Driving?: No Managing money?: No Feeding yourself?:No Getting from bed to chair?:No Climbing a flight of stairs?:No Preparing food and eating?:No Bathing or showering?:No Getting dressed?:No Getting to the toilet?:No Using the toilet?:No Moving around from place to place?: No  Fall Risk Assessment In the past year have you fallen or had a near fall?:No Are you currently taking any medications that make you dizziness?:No   Hearing Difficulties: No Do you often ask people to speak up or repeat themselves?:No Do you experience ringing or noises in your ears?:No Do you have difficulty understanding soft or whispered voices?:No  Cognitive Testing  Alert? Yes Normal Appearance?Yes  Oriented to person? Yes Place? Yes  Time? Yes  Displays appropriate judgment?Yes  Can read the correct time from a watch face? yes Are you having problems remembering  things?No  Advanced Directives have been discussed with the patient?Yes and brochure/forms provided full code   List the Names of Other Physician/Practitioners you currently use: care team up to date    Indicate any recent Medical Services you may have received from other than [facility] providers in the past year (date may be approximate).     Medicare Attestation  I have personally reviewed:  The patient's medical and social history  Their use of alcohol, tobacco or illicit drugs  Their current medications and supplements  The patient's functional ability including ADLs,fall risks, home safety risks, cognitive, and hearing and visual impairment  Diet and physical activities  Evidence for depression or mood disorders  The patient's weight, height, BMI, and visual acuity have been recorded in the chart. I have made referrals, counseling, and provided education to the patient based on review of the above and I have provided the patient with a written personalized care plan for preventive services.    Physical Exam BP 110/70 mmHg  Pulse 66  Resp 18  Ht 5\' 7"  (1.702 m)  Wt 210 lb (95.255 kg)  BMI 32.88 kg/m2  SpO2 97%   Assessment & Plan:   Medicare annual wellness visit, subsequent Annual exam as documented. Counseling done  re healthy lifestyle involving commitment to 150 minutes exercise per week, heart healthy diet, and attaining healthy weight.The importance of adequate sleep also discussed. Regular seat belt use and home safety, is also discussed. Changes in health habits are decided on by the patient with goals and time frames  set for achieving them. Immunization and cancer  screening needs are specifically addressed at this visit.

## 2015-11-16 NOTE — Assessment & Plan Note (Signed)

## 2015-11-16 NOTE — Patient Instructions (Signed)
Annual physical exam in November, call if you need me sooner  Please commit to daily exercise for health, and eating mainly vegetable and fruit   HBA1C, chem 7 and EGFr in Novemebr on day of your visit  Thank you  for choosing [facility]. We consider it a privelige to serve you.  Delivering excellent health care in a caring and  compassionate way is our goal.  Partnering with you,  so that together we can achieve this goal is our strategy.   \Pls work on living will

## 2015-12-21 ENCOUNTER — Other Ambulatory Visit: Payer: Self-pay | Admitting: Family Medicine

## 2016-01-13 ENCOUNTER — Ambulatory Visit (INDEPENDENT_AMBULATORY_CARE_PROVIDER_SITE_OTHER): Payer: Medicare Other | Admitting: Family Medicine

## 2016-01-13 ENCOUNTER — Encounter: Payer: Self-pay | Admitting: Family Medicine

## 2016-01-13 VITALS — BP 124/72 | HR 86 | Resp 16 | Ht 67.0 in | Wt 215.0 lb

## 2016-01-13 DIAGNOSIS — Z23 Encounter for immunization: Secondary | ICD-10-CM

## 2016-01-13 DIAGNOSIS — Z1211 Encounter for screening for malignant neoplasm of colon: Secondary | ICD-10-CM

## 2016-01-13 DIAGNOSIS — R7301 Impaired fasting glucose: Secondary | ICD-10-CM | POA: Diagnosis not present

## 2016-01-13 DIAGNOSIS — N3001 Acute cystitis with hematuria: Secondary | ICD-10-CM | POA: Diagnosis not present

## 2016-01-13 DIAGNOSIS — I10 Essential (primary) hypertension: Secondary | ICD-10-CM | POA: Diagnosis not present

## 2016-01-13 DIAGNOSIS — Z0000 Encounter for general adult medical examination without abnormal findings: Secondary | ICD-10-CM

## 2016-01-13 LAB — POCT URINALYSIS DIPSTICK
BILIRUBIN UA: NEGATIVE
Glucose, UA: NEGATIVE
KETONES UA: NEGATIVE
Nitrite, UA: NEGATIVE
PH UA: 7
Protein, UA: NEGATIVE
SPEC GRAV UA: 1.015
Urobilinogen, UA: 0.2

## 2016-01-13 LAB — POC HEMOCCULT BLD/STL (OFFICE/1-CARD/DIAGNOSTIC): FECAL OCCULT BLD: NEGATIVE

## 2016-01-13 MED ORDER — CIPROFLOXACIN HCL 500 MG PO TABS: 500.0000 mg | ORAL_TABLET | Freq: Two times a day (BID) | ORAL | 0 refills | Status: DC

## 2016-01-13 NOTE — Patient Instructions (Signed)
F/u ion January , call if you need me before    Medication sent to pharmacy, 3 days for bladder infection  Flu vaccine today  HBA1C, chem 7 and EGFr and TSH 1 week before Jan visit  Please work on good  health habits so that your health will improve. 1. Commitment to daily physical activity for 30 to 60  minutes, if you are able to do this.  2. Commitment to wise food choices. Aim for half of your  food intake to be vegetable and fruit, one quarter starchy foods, and one quarter protein. Try to eat on a regular schedule  3 meals per day, snacking between meals should be limited to vegetables or fruits or small portions of nuts. 64 ounces of water per day is generally recommended, unless you have specific health conditions, like heart failure or kidney failure where you will need to limit fluid intake.  3. Commitment to sufficient and a  good quality of physical and mental rest daily, generally between 6 to 8 hours per day.  WITH PERSISTANCE AND PERSEVERANCE, THE IMPOSSIBLE , BECOMES THE NORM! Erika [name]

## 2016-01-13 NOTE — Progress Notes (Signed)
    Erika [name]     MRN: [id number]      DOB: 11-14-1935  HPI: Patient is in for annual physical exam. 2 day h/o blood in urine and pain, no fever or chills, no flank pain. Recent labs, if available are reviewed. Immunization is reviewed , and  updated if needed.   PE: Pleasant  female, alert and oriented x 3, in no cardio-pulmonary distress. Afebrile. HEENT No facial trauma or asymetry. Sinuses non tender.  Extra occullar muscles intact, pupils equally reactive to light. External ears normal, tympanic membranes clear. Oropharynx moist, no exudate. Neck: supple, no adenopathy,JVD or thyromegaly.No bruits.  Chest: Clear to ascultation bilaterally.No crackles or wheezes. Non tender to palpation  Breast: No asymetry,no masses or lumps. No tenderness. No nipple discharge or inversion. No axillary or supraclavicular adenopathy  Cardiovascular system; Heart sounds normal,  S1 and  S2 ,no S3.  No murmur, or thrill. Apical beat not displaced Peripheral pulses normal.  Abdomen: Soft, , no organomegaly or masses.No renal angle tenderness, mild suprapubic tenderness No bruits. Bowel sounds normal. No guarding, tenderness or rebound.  Rectal:  Normal sphincter tone. No rectal mass. Guaiac negative stool.  GU: External genitalia normal female genitalia , normal female distribution of hair. No lesions. Internal exam not done, currently has cystitis and has no pelvic symptoms   Musculoskeletal exam: Full ROM of spine, hips , shoulders and knees. No deformity ,swelling or crepitus noted. No muscle wasting or atrophy.   Neurologic: Cranial nerves 2 to 12 intact. Power, tone ,sensation and reflexes normal throughout. No disturbance in gait. No tremor.  Skin: Intact, no ulceration, erythema , scaling or rash noted. Pigmentation normal throughout  Psych; Normal mood and affect. Judgement and concentration normal   Assessment & Plan:   Need for prophylactic  vaccination and inoculation against influenza After obtaining informed consent, the vaccine is  administered by LPN.   Annual physical exam Annual exam as documented. Counseling done  re healthy lifestyle involving commitment to 150 minutes exercise per week, heart healthy diet, and attaining healthy weight.The importance of adequate sleep also discussed. Regular seat belt use and home safety, is also discussed. Changes in health habits are decided on by the patient with goals and time frames  set for achieving them. Immunization and cancer screening needs are specifically addressed at this visit.   Acute cystitis with hematuria Abn uA and symptomatic , cipro prescribed

## 2016-01-13 NOTE — Assessment & Plan Note (Signed)

## 2016-01-13 NOTE — Assessment & Plan Note (Signed)
After obtaining informed consent, the vaccine is  administered by LPN.  

## 2016-01-13 NOTE — Assessment & Plan Note (Signed)
Abn uA and symptomatic , cipro prescribed

## 2016-01-14 ENCOUNTER — Other Ambulatory Visit (HOSPITAL_COMMUNITY)
Admission: RE | Admit: 2016-01-14 | Discharge: 2016-01-14 | Disposition: A | Discharge status: Home / Self Care | Payer: Medicare Other | Source: Other Acute Inpatient Hospital | Attending: Family Medicine | Admitting: Family Medicine

## 2016-01-14 DIAGNOSIS — N3001 Acute cystitis with hematuria: Secondary | ICD-10-CM | POA: Diagnosis present

## 2016-01-15 LAB — URINE CULTURE

## 2016-01-25 ENCOUNTER — Ambulatory Visit (INDEPENDENT_AMBULATORY_CARE_PROVIDER_SITE_OTHER): Payer: Medicare Other | Admitting: Nurse Practitioner

## 2016-01-25 ENCOUNTER — Encounter: Payer: Self-pay | Admitting: Nurse Practitioner

## 2016-01-25 VITALS — BP 141/76 | HR 73 | Temp 97.8°F | Ht 67.0 in | Wt 215.6 lb

## 2016-01-25 DIAGNOSIS — K5900 Constipation, unspecified: Secondary | ICD-10-CM | POA: Diagnosis not present

## 2016-01-25 NOTE — Assessment & Plan Note (Signed)
Long-standing, chronic constipation improved with Linzess 72 g daily. She is since transitioned to 2 stool softeners a day as well as Dulcolax laxative. This seems to be working for her at this time. Recommend she continue this regimen, return for follow-up as needed. Call us if any problems or worsening symptoms or return of symptoms and we can see her back in the office for another look.

## 2016-01-25 NOTE — Progress Notes (Signed)
cc'ed to pcp °

## 2016-01-25 NOTE — Patient Instructions (Signed)
1. Keep taking her current constipation medications. 2. Return for follow-up as needed for any recurrent or worsening symptoms.

## 2016-01-25 NOTE — Progress Notes (Signed)
Referring Provider: Fayrene Helper, MD Primary Care Physician:  Tula Nakayama, MD Primary GI:  Dr. Gala Romney  Chief Complaint  Patient presents with  . Follow-up    HPI:   Erika [name] is a 80 y.o. female who presents for follow-up on constipation. The patient was last seen in our office 10/26/2015 for the same. At that time she noted a chronic history of constipation since childhood which was began worsening 3 weeks prior to her visit. Colace and Dulcolax helps but not overtly effective. She was started on Linzess 72 g daily, requested to call us in 2 weeks with a progress report, return for follow-up in 2 months.  Follow-up phone call 11/09/2015 stated she was much improved on Linzess.  Today she states she's doing well. Took the Linzess and then her PCP changed her to two stool softeners a day and dulcolax. This is working well for her. Denies abdominal pain, N/V, hematochezia, melena. Heme stool during annual physical last week was negative (results in Epic.) Denies changes in bowel habits, unintentional weight loss. Denies chest pain, dyspnea, dizziness, lightheadedness, syncope, near syncope. Denies any other upper or lower GI symptoms.  Past Medical History:  Diagnosis Date  . Diabetes mellitus, type 2 ([place])    normglycmic off meds since approx 2010  . Hypertension   . Obesity, mild   . Vertigo     Past Surgical History:  Procedure Laterality Date  . ABDOMINAL HYSTERECTOMY    . TOTAL ABDOMINAL HYSTERECTOMY W/ BILATERAL SALPINGOOPHORECTOMY      Current Outpatient Prescriptions  Medication Sig Dispense Refill  . aspirin (ANACIN) 81 MG EC tablet Take 81 mg by mouth daily. Take one tablet by mouth daily       . bisacodyl (DULCOLAX) 5 MG EC tablet Take 5 mg by mouth as needed for moderate constipation.    . Calcium Carbonate-Vitamin D (CALCIUM 600/VITAMIN D) 600-400 MG-UNIT per chew tablet Chew 1 tablet by mouth daily. Take one tablet by mouth two times a day       . cetirizine (ZYRTEC HIVES RELIEF) 10 MG tablet Take 10 mg by mouth daily.      Marland Kitchen docusate sodium (COLACE) 100 MG capsule Take 100 mg by mouth 2 (two) times daily.    Marland Kitchen losartan-hydrochlorothiazide (HYZAAR) 50-12.5 MG tablet TAKE 1 TABLET BY MOUTH DAILY. 90 tablet 0  . Multiple Vitamins-Minerals (CENTRUM SILVER) tablet Take 1 tablet by mouth daily. One tablet by mouth once daily     . Omega-3 Fatty Acids (FISH OIL) 1000 MG CAPS Take 1 capsule by mouth daily.     No current facility-administered medications for this visit.     Allergies as of 01/25/2016 - Review Complete 01/25/2016  Allergen Reaction Noted  . Benazepril-hydrochlorothiazide  03/16/2009  . Gabapentin Itching 08/03/2010  . Restoril [temazepam] Other (See Comments) 11/16/2015    Family History  Problem Relation Age of Onset  . Cancer Mother 28    cervical   . Diabetes Sister   . Diabetes Sister   . Diabetes Sister   . Diabetes Sister   . Heart disease Sister   . Diabetes Brother   . Diabetes Brother   . Heart disease Brother   . Diabetes Brother   . Diabetes Brother   . Diabetes Brother   . Diabetes Sister   . Diabetes Sister   . Diabetes Sister   . Colon cancer Neg Hx     Social History   Social History  .  Marital status: Widowed    Spouse name: N/A  . Number of children: N/A  . Years of education: N/A   Social History Main Topics  . Smoking status: Never Smoker  . Smokeless tobacco: Never Used     Comment: Never smoked  . Alcohol use No  . Drug use: No  . Sexual activity: Not Currently   Other Topics Concern  . None   Social History Narrative  . None    Review of Systems: General: Negative for anorexia, weight loss, fever, chills, fatigue, weakness. ENT: Negative for hoarseness, difficulty swallowing. CV: Negative for chest pain, angina, palpitations, peripheral edema.  Respiratory: Negative for dyspnea at rest, cough, sputum, wheezing.  GI: See history of present illness. Endo:  Negative for unusual weight change.    Physical Exam: BP (!) 141/76   Pulse 73   Temp 97.8 F (36.6 C) (Oral)   Ht 5\' 7"  (1.702 m)   Wt 215 lb 9.6 oz (97.8 kg)   BMI 33.77 kg/m  General:   Morbidly obese female, alert and oriented. Pleasant and cooperative. Well-nourished and well-developed.  Ears:  Normal auditory acuity. Cardiovascular:  S1, S2 present without murmurs appreciated. Extremities without clubbing or edema. Respiratory:  Clear to auscultation bilaterally. No wheezes, rales, or rhonchi. No distress.  Gastrointestinal:  +BS, rounded but soft, non-tender and non-distended. No HSM noted. No guarding or rebound. No masses appreciated.  Rectal:  Deferred  Musculoskalatal:  Symmetrical without gross deformities. Neurologic:  Alert and oriented x4;  grossly normal neurologically. Psych:  Alert and cooperative. Normal mood and affect. Heme/Lymph/Immune: No excessive bruising noted.    01/25/2016 9:38 AM   Disclaimer: This note was dictated with voice recognition software. Similar sounding words can inadvertently be transcribed and may not be corrected upon review.

## 2016-03-14 ENCOUNTER — Other Ambulatory Visit: Payer: Self-pay | Admitting: Family Medicine

## 2016-03-30 ENCOUNTER — Encounter: Payer: Self-pay | Admitting: Family Medicine

## 2016-05-02 ENCOUNTER — Ambulatory Visit: Payer: Medicare Other | Admitting: Family Medicine

## 2016-05-06 LAB — BASIC METABOLIC PANEL WITH GFR
BUN: 13 mg/dL (ref 7–25)
CALCIUM: 8.8 mg/dL (ref 8.6–10.4)
CHLORIDE: 103 mmol/L (ref 98–110)
CO2: 28 mmol/L (ref 20–31)
Creat: 0.61 mg/dL (ref 0.60–0.93)
GFR, Est African American: >89 mL/min (ref >=60)
GFR, Est Non African American: 87 mL/min (ref >=60)
Glucose, Bld: 104 mg/dL — ABNORMAL HIGH (ref 65–99)
Potassium: 4 mmol/L (ref 3.5–5.3)
Sodium: 140 mmol/L (ref 135–146)

## 2016-05-06 LAB — TSH: TSH: 1.9 m[IU]/L

## 2016-05-06 LAB — HEMOGLOBIN A1C
Hgb A1c MFr Bld: 5.8 % — ABNORMAL HIGH (ref <5.7)
Mean Plasma Glucose: 120 mg/dL

## 2016-05-09 ENCOUNTER — Ambulatory Visit (INDEPENDENT_AMBULATORY_CARE_PROVIDER_SITE_OTHER): Payer: Medicare Other | Admitting: Family Medicine

## 2016-05-09 ENCOUNTER — Encounter: Payer: Self-pay | Admitting: Family Medicine

## 2016-05-09 VITALS — BP 130/64 | HR 72 | Resp 16 | Ht 67.0 in | Wt 215.0 lb

## 2016-05-09 DIAGNOSIS — I10 Essential (primary) hypertension: Secondary | ICD-10-CM

## 2016-05-09 DIAGNOSIS — J302 Other seasonal allergic rhinitis: Secondary | ICD-10-CM

## 2016-05-09 DIAGNOSIS — E669 Obesity, unspecified: Secondary | ICD-10-CM

## 2016-05-09 DIAGNOSIS — E66811 Obesity, class 1: Secondary | ICD-10-CM

## 2016-05-09 DIAGNOSIS — R7301 Impaired fasting glucose: Secondary | ICD-10-CM

## 2016-05-09 NOTE — Patient Instructions (Addendum)
Annual wellness  July 19 or after, call if you need me before  Annual physical end November    Congrats on good blood pressure, and good blood sugar.  Fasting lipid, chem 7 and EGFr, hBA1C, CBC, vit D and CBC 1 week before wellness visit in July, call or pick up lab order end June  Please work on good  health habits so that your health will improve. 1. Commitment to daily physical activity for 30 to 60  minutes, if you are able to do this.  2. Commitment to wise food choices. Aim for half of your  food intake to be vegetable and fruit, one quarter starchy foods, and one quarter protein. Try to eat on a regular schedule  3 meals per day, snacking between meals should be limited to vegetables or fruits or small portions of nuts. 64 ounces of water per day is generally recommended, unless you have specific health conditions, like heart failure or kidney failure where you will need to limit fluid intake.  3. Commitment to sufficient and a  good quality of physical and mental rest daily, generally between 6 to 8 hours per day.  WITH PERSISTANCE AND PERSEVERANCE, THE IMPOSSIBLE , BECOMES THE NORM! Weight loss goal of 2 pounds per month

## 2016-05-14 NOTE — Assessment & Plan Note (Signed)
Patient educated about the importance of limiting  Carbohydrate intake , the need to commit to daily physical activity for a minimum of 30 minutes , and to commit weight loss. The fact that changes in all these areas will reduce or eliminate all together the development of diabetes is stressed.  Deteriorated Diabetic Labs Latest Ref Rng & Units 05/05/2016 10/21/2015 06/22/2015 01/07/2015 07/30/2014  HbA1c <5.7 % 5.8(H) 5.7(H) 5.9(H) 5.8(H) 5.9(H)  Microalbumin 0.00 - 1.89 mg/dL - - - - -  Micro/Creat Ratio 0.0 - 30.0 mg/g - - - - -  Chol 125 - 200 mg/dL - - 151 - 164  HDL >=46 mg/dL - - 51 - 54  Calc LDL <130 mg/dL - - 71 - 87  Triglycerides <150 mg/dL - - 146 - 114  Creatinine 0.60 - 0.93 mg/dL 0.61 0.75 0.74 0.66 0.73   BP/Weight 05/09/2016 01/25/2016 01/13/2016 11/16/2015 10/26/2015 10/21/2015 [id number]  Systolic BP [id number] [id number]  Diastolic BP 64 76 72 70 64 74 80  Wt. (Lbs) 215 215.6 215 210 210.4 210 -  BMI 33.67 33.77 33.67 32.88 32.95 32.88 -   Foot/eye exam completion dates 03/16/2010 02/11/2010  Eye Exam - normal  Foot exam Order yes -  Foot Form Completion - -

## 2016-05-14 NOTE — Progress Notes (Signed)
[name]     MRN: [id number]      DOB: [birth year]   HPI [name] is here for follow up and re-evaluation of chronic medical conditions, medication management and review of any available recent lab and radiology data.  Preventive health is updated, specifically  Cancer screening and Immunization.    The PT denies any adverse reactions to current medications since the last visit.  There are no new concerns.  There are no specific complaints, disappointed with weight , and still not exercising as in the past, but plans to resume this   ROS Denies recent fever or chills. Denies sinus pressure, nasal congestion, ear pain or sore throat. Denies chest congestion, productive cough or wheezing. Denies chest pains, palpitations and leg swelling Denies abdominal pain, nausea, vomiting,diarrhea or constipation.   Denies dysuria, frequency, hesitancy or incontinence. Denies joint pain, swelling and limitation in mobility. Denies headaches, seizures, numbness, or tingling. Denies depression, anxiety or insomnia. Denies skin break down or rash.   PE  BP 130/64   Pulse 72   Resp 16   Ht 5\' 7"  (1.702 m)   Wt 215 lb (97.5 kg)   SpO2 98%   BMI 33.67 kg/m   Patient alert and oriented and in no cardiopulmonary distress.  HEENT: No facial asymmetry, EOMI,   oropharynx pink and moist.  Neck supple no JVD, no mass.  Chest: Clear to auscultation bilaterally.  CVS: S1, S2 no murmurs, no S3.Regular rate.  ABD: Soft non tender.   Ext: No edema  MS: Adequate ROM spine, shoulders, hips and knees.  Skin: Intact, no ulcerations or rash noted.  Psych: Good eye contact, normal affect. Memory intact not anxious or depressed appearing.  CNS: CN 2-12 intact, power,  normal throughout.no focal deficits noted.   Assessment & Plan  Essential hypertension Controlled, no change in medication DASH diet and commitment to daily physical activity for a minimum of 30 minutes discussed and  encouraged, as a part of hypertension management. The importance of attaining a healthy weight is also discussed.  BP/Weight 05/09/2016 01/25/2016 01/13/2016 11/16/2015 10/26/2015 10/21/2015 [id number]  Systolic BP [id number] [id number] [id number]  Diastolic BP 64 76 72 70 64 74 80  Wt. (Lbs) 215 215.6 215 210 210.4 210 -  BMI 33.67 33.77 33.67 32.88 32.95 32.88 -       Obesity (BMI 30.0-34.9) Deteriorated. Patient re-educated about  the importance of commitment to a  minimum of 150 minutes of exercise per week.  The importance of healthy food choices with portion control discussed. Encouraged to start a food diary, count calories and to consider  joining a support group. Sample diet sheets offered. Goals set by the patient for the next several months.   Weight /BMI 05/09/2016 01/25/2016 01/13/2016  WEIGHT 215 lb 215 lb 9.6 oz 215 lb  HEIGHT 5\' 7"  5\' 7"  5\' 7"   BMI 33.67 kg/m2 33.77 kg/m2 33.67 kg/m2      IMPAIRED FASTING GLUCOSE Patient educated about the importance of limiting  Carbohydrate intake , the need to commit to daily physical activity for a minimum of 30 minutes , and to commit weight loss. The fact that changes in all these areas will reduce or eliminate all together the development of diabetes is stressed.  Deteriorated Diabetic Labs Latest Ref Rng & Units 05/05/2016 10/21/2015 06/22/2015 01/07/2015 07/30/2014  HbA1c <5.7 % 5.8(H) 5.7(H) 5.9(H) 5.8(H) 5.9(H)  Microalbumin 0.00 - 1.89 mg/dL - - - - -  Micro/Creat Ratio 0.0 - 30.0 mg/g - - - - -  Chol 125 - 200 mg/dL - - 151 - 164  HDL >=46 mg/dL - - 51 - 54  Calc LDL <130 mg/dL - - 71 - 87  Triglycerides <150 mg/dL - - 146 - 114  Creatinine 0.60 - 0.93 mg/dL 0.61 0.75 0.74 0.66 0.73   BP/Weight 05/09/2016 01/25/2016 01/13/2016 11/16/2015 10/26/2015 10/21/2015 A999333  Systolic BP [id number] A999333 A999333 [id number]  Diastolic BP 64 76 72 70 64 74 80  Wt. (Lbs) 215 215.6 215 210 210.4 210 -  BMI 33.67 33.77 33.67 32.88 32.95 32.88 -   Foot/eye  exam completion dates 03/16/2010 02/11/2010  Eye Exam - normal  Foot exam Order yes -  Foot Form Completion - -      Allergic rhinitis Intermittent flares, good response to OTC medication as needed, continue same

## 2016-05-14 NOTE — Assessment & Plan Note (Signed)
Controlled, no change in medication DASH diet and commitment to daily physical activity for a minimum of 30 minutes discussed and encouraged, as a part of hypertension management. The importance of attaining a healthy weight is also discussed.  BP/Weight 05/09/2016 01/25/2016 01/13/2016 11/16/2015 10/26/2015 10/21/2015 A999333  Systolic BP [id number]  Diastolic BP 64 76 72 70 64 74 80  Wt. (Lbs) 215 215.6 215 210 210.4 210 -  BMI 33.67 33.77 33.67 32.88 32.95 32.88 -

## 2016-05-14 NOTE — Assessment & Plan Note (Signed)
Intermittent flares, good response to OTC medication as needed, continue same

## 2016-05-14 NOTE — Assessment & Plan Note (Signed)
Deteriorated. Patient re-educated about  the importance of commitment to a  minimum of 150 minutes of exercise per week.  The importance of healthy food choices with portion control discussed. Encouraged to start a food diary, count calories and to consider  joining a support group. Sample diet sheets offered. Goals set by the patient for the next several months.   Weight /BMI 05/09/2016 01/25/2016 01/13/2016  WEIGHT 215 lb 215 lb 9.6 oz 215 lb  HEIGHT 5\' 7"  5\' 7"  5\' 7"   BMI 33.67 kg/m2 33.77 kg/m2 33.67 kg/m2

## 2016-09-03 ENCOUNTER — Other Ambulatory Visit: Payer: Self-pay | Admitting: Family Medicine

## 2016-10-03 ENCOUNTER — Other Ambulatory Visit: Payer: Self-pay | Admitting: Family Medicine

## 2016-10-03 DIAGNOSIS — Z1231 Encounter for screening mammogram for malignant neoplasm of breast: Secondary | ICD-10-CM

## 2016-11-15 ENCOUNTER — Ambulatory Visit (HOSPITAL_COMMUNITY)
Admission: RE | Admit: 2016-11-15 | Discharge: 2016-11-15 | Disposition: A | Discharge status: Home / Self Care | Payer: Medicare Other | Source: Ambulatory Visit | Attending: Family Medicine | Admitting: Family Medicine

## 2016-11-15 DIAGNOSIS — Z1231 Encounter for screening mammogram for malignant neoplasm of breast: Secondary | ICD-10-CM | POA: Diagnosis not present

## 2016-11-21 ENCOUNTER — Encounter: Payer: Self-pay | Admitting: Family Medicine

## 2016-11-21 ENCOUNTER — Ambulatory Visit: Payer: Medicare Other

## 2016-11-21 ENCOUNTER — Ambulatory Visit (INDEPENDENT_AMBULATORY_CARE_PROVIDER_SITE_OTHER): Payer: Medicare Other | Admitting: Family Medicine

## 2016-11-21 VITALS — BP 130/72 | HR 57 | Temp 97.2°F | Resp 17 | Ht 67.0 in | Wt 212.8 lb

## 2016-11-21 DIAGNOSIS — Z7189 Other specified counseling: Secondary | ICD-10-CM

## 2016-11-21 DIAGNOSIS — Z0000 Encounter for general adult medical examination without abnormal findings: Secondary | ICD-10-CM | POA: Diagnosis not present

## 2016-11-21 NOTE — Progress Notes (Signed)
Preventive Screening-Counseling & Management   Patient present here today for a Medicare annual wellness visit.   Current Problems (verified)   Medications Prior to Visit Allergies (verified)   PAST HISTORY  Family History  Social History Widow, no children, lives independently   Risk Factors  Current exercise habits:  Walking every day weather permitting, at least 30 minutes a day  Dietary issues discussed: Is currently taking prednisone to treat a sore arm after falling. Since taking the medication, she is c/o weight gain and increased appetite. Avoids sugar.    Cardiac risk factors: hypertension, obesity  Depression Screen  (Note: if answer to either of the following is "Yes", a more complete depression screening is indicated)   Over the past two weeks, have you felt down, depressed or hopeless? No  Over the past two weeks, have you felt little interest or pleasure in doing things? No  Have you lost interest or pleasure in daily life? No  Do you often feel hopeless? No  Do you cry easily over simple problems? No   Activities of Daily Living  In your present state of health, do you have any difficulty performing the following activities?  Driving?: No Managing money?: No Feeding yourself?:No Getting from bed to chair?:No Climbing a flight of stairs?:No Preparing food and eating?:No Bathing or showering?:No Getting dressed?:No Getting to the toilet?:No Using the toilet?:No Moving around from place to place?: No  Fall Risk Assessment In the past year have you fallen or had a near fall?: Yes., 1 month ago, tripped on a stump, hurt right arm and unable to raise right arm treated 1 week ago with steroids much improved Are you currently taking any medications that make you dizzy?: No   Hearing Difficulties: No Do you often ask people to speak up or repeat themselves?:No Do you experience ringing or noises in your ears?:No Do you have difficulty understanding soft or  whispered voices?:No  Cognitive Testing  Alert? Yes Normal Appearance?Yes  Oriented to person? Yes Place? Yes  Time? Yes  Displays appropriate judgment?Yes  Can read the correct time from a watch face? yes Are you having problems remembering things?No  Advanced Directives have been discussed with the patient?Yes    List the Names of Other Physician/Practitioners you currently use: n/a   Indicate any recent Medical Services you may have received from other than [facility] providers in the past year (date may be approximate). Patient went to Urgent Care following a fall, 11/15/16.    Medicare Attestation  I have personally reviewed:  The patient's medical and social history  Their use of alcohol, tobacco or illicit drugs  Their current medications and supplements  The patient's functional ability including ADLs,fall risks, home safety risks, cognitive, and hearing and visual impairment  Diet and physical activities  Evidence for depression or mood disorders  The patient's weight, height, BMI, and visual acuity have been recorded in the chart. I have made referrals, counseling, and provided education to the patient based on review of the above and I have provided the patient with a written personalized care plan for preventive services.    Physical Exam BP 130/72 (BP Location: Left Arm, Patient Position: Sitting, Cuff Size: Normal)   Pulse (!) 57   Temp (!) 97.2 F (36.2 C) (Other (Comment))   Resp 17   Ht 5\' 7"  (1.702 m)   Wt 212 lb 12 oz (96.5 kg)   SpO2 98%   BMI 33.32 kg/m    Assessment & Plan:  Medicare annual wellness visit, subsequent Annual exam as documented. Counseling done  re healthy lifestyle involving commitment to 150 minutes exercise per week, heart healthy diet, and attaining healthy weight.The importance of adequate sleep also discussed. Regular seat belt use and home safety, is also discussed. Changes in health habits are decided on by the patient with goals  and time frames  set for achieving them. Immunization and cancer screening needs are specifically addressed at this visit.   Advanced care planning/counseling discussion Sixteen minutes spent in advanced care planning. Paperwork provided and reviewed with patient. She has a niece who she has determined to be her [place], and she intends to discuss furthers with her niece and have the paper work completed. Questions were answered. Patient very interested and engaged in the discussion and wants living will completed, seems to have had some paperwork in the past which she states she was recently told had expired , which did not make sense to her Instructed to have form notarized, keep a copy at her home and return a copy for placement in her medical record

## 2016-11-21 NOTE — Patient Instructions (Addendum)
Keep November appointment as before  Please review living will/ advanced directives as we have discussed, and I will provide the paperwork   Thank you  for choosing [facility]. We consider it a privelige to serve you.  Delivering excellent health care in a caring and  compassionate way is our goal.  Partnering with you,  so that together we can achieve this goal is our strategy.

## 2016-11-26 DIAGNOSIS — Z7189 Other specified counseling: Secondary | ICD-10-CM | POA: Insufficient documentation

## 2016-11-26 NOTE — Assessment & Plan Note (Signed)

## 2016-11-26 NOTE — Assessment & Plan Note (Addendum)
Sixteen minutes spent in advanced care planning. Paperwork provided and reviewed with patient. She has a niece who she has determined to be her [place], and she intends to discuss furthers with her niece and have the paper work completed. Questions were answered. Patient very interested and engaged in the discussion and wants living will completed, seems to have had some paperwork in the past which she states she was recently told had expired , which did not make sense to her Instructed to have form notarized, keep a copy at her home and return a copy for placement in her medical record

## 2017-03-15 ENCOUNTER — Other Ambulatory Visit: Payer: Self-pay | Admitting: Family Medicine

## 2017-03-21 ENCOUNTER — Other Ambulatory Visit: Payer: Self-pay | Admitting: Family Medicine

## 2017-03-27 ENCOUNTER — Ambulatory Visit (INDEPENDENT_AMBULATORY_CARE_PROVIDER_SITE_OTHER): Payer: Medicare Other | Admitting: Family Medicine

## 2017-03-27 ENCOUNTER — Encounter: Payer: Self-pay | Admitting: Family Medicine

## 2017-03-27 VITALS — BP 144/74 | HR 71 | Resp 16 | Ht 67.0 in | Wt 215.0 lb

## 2017-03-27 DIAGNOSIS — E669 Obesity, unspecified: Secondary | ICD-10-CM | POA: Diagnosis not present

## 2017-03-27 DIAGNOSIS — Z0000 Encounter for general adult medical examination without abnormal findings: Secondary | ICD-10-CM

## 2017-03-27 DIAGNOSIS — I10 Essential (primary) hypertension: Secondary | ICD-10-CM

## 2017-03-27 DIAGNOSIS — R7301 Impaired fasting glucose: Secondary | ICD-10-CM

## 2017-03-27 DIAGNOSIS — Z23 Encounter for immunization: Secondary | ICD-10-CM

## 2017-03-27 DIAGNOSIS — Z1211 Encounter for screening for malignant neoplasm of colon: Secondary | ICD-10-CM

## 2017-03-27 LAB — POC HEMOCCULT BLD/STL (OFFICE/1-CARD/DIAGNOSTIC): Fecal Occult Blood, POC: NEGATIVE

## 2017-03-27 NOTE — Progress Notes (Signed)
Erika [name]     MRN: [id number]      DOB: 10/24/1935  HPI: Patient is in for annual physical exam. No other health concerns are expressed or addressed at the visit. Recent labs, if available are reviewed. Immunization is reviewed , and  updated if needed.   PE: BP (!) 144/74   Pulse 71   Resp 16   Ht 5\' 7"  (1.702 m)   Wt 215 lb (97.5 kg)   SpO2 98%   BMI 33.67 kg/m   Pleasant  female, alert and oriented x 3, in no cardio-pulmonary distress. Afebrile. HEENT No facial trauma or asymetry. Sinuses non tender.  Extra occullar muscles intact, pupils equally reactive to light. External ears normal, tympanic membranes clear. Oropharynx moist, no exudate. Neck: supple, no adenopathy,JVD or thyromegaly.No bruits.  Chest: Clear to ascultation bilaterally.No crackles or wheezes. Non tender to palpation  Breast: No asymetry,no masses or lumps. No tenderness. No nipple discharge or inversion. No axillary or supraclavicular adenopathy  Cardiovascular system; Heart sounds normal,  S1 and  S2 ,no S3.  No murmur, or thrill. Apical beat not displaced Peripheral pulses normal.  Abdomen: Soft, non tender, no organomegaly or masses. No bruits. Bowel sounds normal. No guarding, tenderness or rebound.  Rectal:  Normal sphincter tone. No rectal mass. Guaiac negative stool.  GU: Not examoined   Musculoskeletal exam: Adequate though reduced ROM of spine, hips , shoulders and knees. No deformity ,swelling or crepitus noted. No muscle wasting or atrophy.   Neurologic: Cranial nerves 2 to 12 intact. Power, tone ,sensation and reflexes normal throughout. No disturbance in gait. No tremor.  Skin: Intact, no ulceration, erythema , scaling or rash noted. Pigmentation normal throughout  Psych; Normal mood and affect. Judgement and concentration normal   Assessment & Plan:  Annual physical exam Annual exam as documented. Counseling done  re healthy lifestyle  involving commitment to 150 minutes exercise per week, heart healthy diet, and attaining healthy weight.The importance of adequate sleep also discussed.  Changes in health habits are decided on by the patient with goals and time frames  set for achieving them. Immunization and cancer screening needs are specifically addressed at this visit.   Essential hypertension Not at goal, no med change DASH diet and commitment to daily physical activity for a minimum of 30 minutes discussed and encouraged, as a part of hypertension management. The importance of attaining a healthy weight is also discussed.  BP/Weight 03/27/2017 11/21/2016 05/09/2016 01/25/2016 01/13/2016 11/16/2015 9/92/4268  Systolic BP 341 962 229 798 921 194 174  Diastolic BP 74 72 64 76 72 70 64  Wt. (Lbs) 215 212.75 215 215.6 215 210 210.4  BMI 33.67 33.32 33.67 33.77 33.67 32.88 32.95       IMPAIRED FASTING GLUCOSE Patient educated about the importance of limiting  Carbohydrate intake , the need to commit to daily physical activity for a minimum of 30 minutes , and to commit weight loss. The fact that changes in all these areas will reduce or eliminate all together the development of diabetes is stressed.  deteriorated  Diabetic Labs Latest Ref Rng & Units 03/21/2017 05/05/2016 10/21/2015 06/22/2015 01/07/2015  HbA1c <5.7 % of total Hgb 6.0(H) 5.8(H) 5.7(H) 5.9(H) 5.8(H)  Microalbumin 0.00 - 1.89 mg/dL - - - - -  Micro/Creat Ratio 0.0 - 30.0 mg/g - - - - -  Chol <200 mg/dL 163 - - 151 -  HDL >50 mg/dL 53 - - 51 -  Calc LDL <  130 mg/dL - - - 71 -  Triglycerides <150 mg/dL 137 - - 146 -  Creatinine 0.60 - 0.93 mg/dL 0.72 0.61 0.75 0.74 0.66   BP/Weight 03/27/2017 11/21/2016 05/09/2016 01/25/2016 01/13/2016 11/16/2015 2/92/4462  Systolic BP 863 817 711 657 903 833 383  Diastolic BP 74 72 64 76 72 70 64  Wt. (Lbs) 215 212.75 215 215.6 215 210 210.4  BMI 33.67 33.32 33.67 33.77 33.67 32.88 32.95   Foot/eye exam completion dates  03/16/2010 02/11/2010  Eye Exam - normal  Foot exam Order yes -  Foot Form Completion - -      Obesity (BMI 30.0-34.9) Deteriorated. Patient re-educated about  the importance of commitment to a  minimum of 150 minutes of exercise per week.  The importance of healthy food choices with portion control discussed. Encouraged to start a food diary, count calories and to consider  joining a support group. Sample diet sheets offered. Goals set by the patient for the next several months.   Weight /BMI 03/27/2017 11/21/2016 05/09/2016  WEIGHT 215 lb 212 lb 12 oz 215 lb  HEIGHT 5\' 7"  5\' 7"  5\' 7"   BMI 33.67 kg/m2 33.32 kg/m2 33.67 kg/m2

## 2017-03-27 NOTE — Patient Instructions (Addendum)
F/u in 4 months, call if you need me before   Blood pressure is high  Please stop snacks that you have, change to fresh or frozen fruit and vegetables  Please work on weight loss   It is important that you exercise regularly at least 30 minutes 5 times a week. If you develop chest pain, have severe difficulty breathing, or feel very tired, stop exercising immediately and seek medical attention    3 months of medication is being sent for the   Fungus  In toenals after we check your liver , and we willalso check your cholesterol  Thank you  for choosing [facility]. We consider it a privelige to serve you.  Delivering excellent health care in a caring and  compassionate way is our goal.  Partnering with you,  so that together we can achieve this goal is our strategy.

## 2017-03-28 LAB — BASIC METABOLIC PANEL WITH GFR
BUN: 16 mg/dL (ref 7–25)
CALCIUM: 9.3 mg/dL (ref 8.6–10.4)
CO2: 31 mmol/L (ref 20–32)
CREATININE: 0.72 mg/dL (ref 0.60–0.93)
Chloride: 102 mmol/L (ref 98–110)
GFR, EST AFRICAN AMERICAN: 93 mL/min/{1.73_m2} (ref >=60)
GFR, EST NON AFRICAN AMERICAN: 80 mL/min/{1.73_m2} (ref >=60)
Glucose, Bld: 117 mg/dL — ABNORMAL HIGH (ref 65–99)
POTASSIUM: 3.9 mmol/L (ref 3.5–5.3)
Sodium: 140 mmol/L (ref 135–146)

## 2017-03-28 LAB — LIPID PANEL
CHOL/HDL RATIO: 3.1 (calc) (ref <5.0)
CHOLESTEROL: 163 mg/dL (ref <200)
HDL: 53 mg/dL (ref >50)
LDL CHOLESTEROL (CALC): 86 mg/dL
Non-HDL Cholesterol (Calc): 110 mg/dL (calc) (ref <130)
TRIGLYCERIDES: 137 mg/dL (ref <150)

## 2017-03-28 LAB — HEMOGLOBIN A1C
HEMOGLOBIN A1C: 6 %{Hb} — AB (ref <5.7)
MEAN PLASMA GLUCOSE: 126 (calc)
eAG (mmol/L): 7 (calc)

## 2017-03-28 LAB — HEPATIC FUNCTION PANEL
AG Ratio: 1.7 (calc) (ref 1.0–2.5)
ALKALINE PHOSPHATASE (APISO): 78 U/L (ref 33–130)
ALT: 15 U/L (ref 6–29)
AST: 18 U/L (ref 10–35)
Albumin: 4.2 g/dL (ref 3.6–5.1)
BILIRUBIN INDIRECT: 0.3 mg/dL (ref 0.2–1.2)
BILIRUBIN TOTAL: 0.3 mg/dL (ref 0.2–1.2)
Bilirubin, Direct: 0 mg/dL (ref 0.0–0.2)
Globulin: 2.5 g/dL (calc) (ref 1.9–3.7)
Total Protein: 6.7 g/dL (ref 6.1–8.1)

## 2017-03-30 ENCOUNTER — Encounter: Payer: Self-pay | Admitting: Family Medicine

## 2017-03-30 NOTE — Assessment & Plan Note (Addendum)
Patient educated about the importance of limiting  Carbohydrate intake , the need to commit to daily physical activity for a minimum of 30 minutes , and to commit weight loss. The fact that changes in all these areas will reduce or eliminate all together the development of diabetes is stressed.  deteriorated  Diabetic Labs Latest Ref Rng & Units 03/21/2017 05/05/2016 10/21/2015 06/22/2015 01/07/2015  HbA1c <5.7 % of total Hgb 6.0(H) 5.8(H) 5.7(H) 5.9(H) 5.8(H)  Microalbumin 0.00 - 1.89 mg/dL - - - - -  Micro/Creat Ratio 0.0 - 30.0 mg/g - - - - -  Chol <200 mg/dL 163 - - 151 -  HDL >50 mg/dL 53 - - 51 -  Calc LDL <130 mg/dL - - - 71 -  Triglycerides <150 mg/dL 137 - - 146 -  Creatinine 0.60 - 0.93 mg/dL 0.72 0.61 0.75 0.74 0.66   BP/Weight 03/27/2017 11/21/2016 05/09/2016 01/25/2016 01/13/2016 11/16/2015 07/22/5571  Systolic BP 220 254 270 623 762 831 517  Diastolic BP 74 72 64 76 72 70 64  Wt. (Lbs) 215 212.75 215 215.6 215 210 210.4  BMI 33.67 33.32 33.67 33.77 33.67 32.88 32.95   Foot/eye exam completion dates 03/16/2010 02/11/2010  Eye Exam - normal  Foot exam Order yes -  Foot Form Completion - -

## 2017-03-30 NOTE — Assessment & Plan Note (Signed)
Not at goal, no med change DASH diet and commitment to daily physical activity for a minimum of 30 minutes discussed and encouraged, as a part of hypertension management. The importance of attaining a healthy weight is also discussed.  BP/Weight 03/27/2017 11/21/2016 05/09/2016 01/25/2016 01/13/2016 11/16/2015 4/69/5072  Systolic BP 257 505 183 358 251 898 421  Diastolic BP 74 72 64 76 72 70 64  Wt. (Lbs) 215 212.75 215 215.6 215 210 210.4  BMI 33.67 33.32 33.67 33.77 33.67 32.88 32.95

## 2017-03-30 NOTE — Assessment & Plan Note (Signed)
Annual exam as documented. Counseling done  re healthy lifestyle involving commitment to 150 minutes exercise per week, heart healthy diet, and attaining healthy weight.The importance of adequate sleep also discussed. Changes in health habits are decided on by the patient with goals and time frames  set for achieving them. Immunization and cancer screening needs are specifically addressed at this visit. 

## 2017-03-30 NOTE — Assessment & Plan Note (Signed)
Deteriorated. Patient re-educated about  the importance of commitment to a  minimum of 150 minutes of exercise per week.  The importance of healthy food choices with portion control discussed. Encouraged to start a food diary, count calories and to consider  joining a support group. Sample diet sheets offered. Goals set by the patient for the next several months.   Weight /BMI 03/27/2017 11/21/2016 05/09/2016  WEIGHT 215 lb 212 lb 12 oz 215 lb  HEIGHT 5\' 7"  5\' 7"  5\' 7"   BMI 33.67 kg/m2 33.32 kg/m2 33.67 kg/m2

## 2017-05-07 ENCOUNTER — Telehealth: Payer: Self-pay | Admitting: Family Medicine

## 2017-05-07 NOTE — Telephone Encounter (Signed)
Pt is concerned about the BPressure medicine given her Cancer,,Please call and advise  [phone]

## 2017-05-07 NOTE — Telephone Encounter (Signed)
pls explain that the medication she is prescribed is not the medication that has been withdrawn because of an association with cancer, I tried to call, no response

## 2017-05-08 NOTE — Telephone Encounter (Signed)
Patient informed of message below, verbalized understanding.  

## 2017-05-17 ENCOUNTER — Ambulatory Visit (HOSPITAL_COMMUNITY)
Admission: RE | Admit: 2017-05-17 | Discharge: 2017-05-17 | Disposition: A | Discharge status: Home / Self Care | Payer: Medicare Other | Source: Ambulatory Visit | Attending: Family Medicine | Admitting: Family Medicine

## 2017-05-17 ENCOUNTER — Ambulatory Visit: Payer: Medicare Other | Admitting: Family Medicine

## 2017-05-17 ENCOUNTER — Encounter: Payer: Self-pay | Admitting: Family Medicine

## 2017-05-17 VITALS — BP 124/68 | HR 74 | Temp 98.4°F | Resp 16 | Ht 67.0 in | Wt 211.0 lb

## 2017-05-17 DIAGNOSIS — I10 Essential (primary) hypertension: Secondary | ICD-10-CM | POA: Diagnosis not present

## 2017-05-17 DIAGNOSIS — J209 Acute bronchitis, unspecified: Secondary | ICD-10-CM

## 2017-05-17 DIAGNOSIS — R918 Other nonspecific abnormal finding of lung field: Secondary | ICD-10-CM | POA: Insufficient documentation

## 2017-05-17 DIAGNOSIS — E669 Obesity, unspecified: Secondary | ICD-10-CM

## 2017-05-17 DIAGNOSIS — R7301 Impaired fasting glucose: Secondary | ICD-10-CM

## 2017-05-17 MED ORDER — PROMETHAZINE-DM 6.25-15 MG/5ML PO SYRP: ORAL_SOLUTION | ORAL | 0 refills | Status: DC

## 2017-05-17 MED ORDER — BENZONATATE 100 MG PO CAPS: 100.0000 mg | ORAL_CAPSULE | Freq: Two times a day (BID) | ORAL | 0 refills | Status: DC | PRN

## 2017-05-17 MED ORDER — PREDNISONE 5 MG PO TABS: ORAL_TABLET | ORAL | 0 refills | Status: DC

## 2017-05-17 MED ORDER — PENICILLIN V POTASSIUM 500 MG PO TABS: 500.0000 mg | ORAL_TABLET | Freq: Three times a day (TID) | ORAL | 0 refills | Status: DC

## 2017-05-17 NOTE — Assessment & Plan Note (Signed)
1 week history with wheeze, cXR antibiotic, decongestant, and cough suppressant

## 2017-05-17 NOTE — Patient Instructions (Addendum)
F/u in March as before, call if you need me sooner  You are being treated for acute bronchitis and 4 medications are sent to your pharmacy  Please get your CXR at the hospital after you leave , we will call you tomorrow with the result   HBA1C, chem 7 and EGFr, TSH and Vit D and CBC 5 days before March visit, we will mail this to you

## 2017-05-17 NOTE — Progress Notes (Signed)
Erika [name]     MRN: [id number]      DOB: 12/07/1935   HPI Erika [name]  1 week h/o worsening  chest congestion, associated with fever and chills intermittently. [facility] [facility] Sputum is thick when she is able to produce this C/o bilateral ear pressure, denies hearing loss and does c/o sore throat. Increasing fatigue , poor appetitie and sleep disturbed by cough. C/o head congestion and pressure, denies nasal drainage No improvement with OTC medication. C/o sore throat  ROS . Denies chest pains, palpitations and leg swelling Denies abdominal pain, nausea, vomiting,diarrhea or constipation.   Denies dysuria, frequency, hesitancy or incontinence. Denies joint pain, swelling and limitation in mobility. Denies headaches, seizures, numbness, or tingling. Denies depression, anxiety or insomnia. Denies skin break down or rash.   PE  BP 124/68   Pulse 74   Temp 98.4 F (36.9 C) (Oral)   Resp 16   Ht 5\' 7"  (1.702 m)   Wt 211 lb (95.7 kg)   SpO2 98%   BMI 33.05 kg/m   Patient alert and oriented and in no cardiopulmonary distress.Ill appearing  HEENT: No facial asymmetry, EOMI,   oropharynx pink and moist.  Neck supple no JVD, no mass.TM clear bilaterally, no sinus tenderness, nasal mucosa edematous, no drainage from nostrils, oropharynx mildly erythematous, no exudate  Chest: Decreased air entry , scattered cackles, no wheezes  CVS: S1, S2 no murmurs, no S3.Regular rate.  ABD: Soft non tender.   Ext: No edema  MS: Adequate though reduced  ROM spine, shoulders, hips and knees.  Skin: Intact, no ulcerations or rash noted.  Psych: Good eye contact, normal affect. Memory intact not anxious or depressed appearing.  CNS: CN 2-12 intact, power,  normal throughout.no focal deficits noted.   Assessment & Plan  Acute bronchitis 1 week history with wheeze, cXR antibiotic, decongestant, and cough suppressant  Essential hypertension Controlled, no change in medication   Obesity  (BMI 30.0-34.9) Improved, pt applauded on this and encouraged to continue to reduce intake of sweets and carbs and increase intake of vegetables and fruit and water and re commit to regular exercise when able     IMPAIRED FASTING GLUCOSE Patient educated about the importance of limiting  Carbohydrate intake , the need to commit to daily physical activity for a minimum of 30 minutes , and to commit weight loss. The fact that changes in all these areas will reduce or eliminate all together the development of diabetes is stressed. Updated lab needed at/ before next visit.   Diabetic Labs Latest Ref Rng & Units 03/21/2017 05/05/2016 10/21/2015 06/22/2015 01/07/2015  HbA1c <5.7 % of total Hgb 6.0(H) 5.8(H) 5.7(H) 5.9(H) 5.8(H)  Microalbumin 0.00 - 1.89 mg/dL - - - - -  Micro/Creat Ratio 0.0 - 30.0 mg/g - - - - -  Chol <200 mg/dL 163 - - 151 -  HDL >50 mg/dL 53 - - 51 -  Calc LDL <130 mg/dL - - - 71 -  Triglycerides <150 mg/dL 137 - - 146 -  Creatinine 0.60 - 0.93 mg/dL 0.72 0.61 0.75 0.74 0.66   BP/Weight 05/17/2017 03/27/2017 11/21/2016 05/09/2016 01/25/2016 01/13/2016 10/26/3149  Systolic BP 761 607 371 062 694 854 627  Diastolic BP 68 74 72 64 76 72 70  Wt. (Lbs) 211 215 212.75 215 215.6 215 210  BMI 33.05 33.67 33.32 33.67 33.77 33.67 32.88   Foot/eye exam completion dates 03/16/2010 02/11/2010  Eye Exam - normal  Foot exam Order yes -  Foot Form Completion - -

## 2017-05-18 ENCOUNTER — Other Ambulatory Visit: Payer: Self-pay | Admitting: Family Medicine

## 2017-05-18 ENCOUNTER — Telehealth: Payer: Self-pay | Admitting: Family Medicine

## 2017-05-18 ENCOUNTER — Telehealth: Payer: Self-pay

## 2017-05-18 DIAGNOSIS — R911 Solitary pulmonary nodule: Secondary | ICD-10-CM

## 2017-05-18 NOTE — Telephone Encounter (Signed)
Call report from [facility] on chest xray. Xray shoes 3.8 cm mass in lung. Chest CT with contrast is recommended. Please advise

## 2017-05-18 NOTE — Telephone Encounter (Signed)
I spoke with pt and advised her of the need for additional imaging of her lung due to concern  Re a potential "growth", please schedule her chest scan, I h will now enter the order, please try and arrange for next week if possible

## 2017-05-18 NOTE — Telephone Encounter (Signed)
I will call pt and let her know and will order the chest scan, will msg you when I speak with her

## 2017-05-18 NOTE — Telephone Encounter (Signed)
Noted, thanks!

## 2017-05-18 NOTE — Telephone Encounter (Signed)
First available appt 1/30. Patient aware

## 2017-05-18 NOTE — Telephone Encounter (Signed)
Scheduled and patient aware

## 2017-05-20 NOTE — Assessment & Plan Note (Signed)
Patient educated about the importance of limiting  Carbohydrate intake , the need to commit to daily physical activity for a minimum of 30 minutes , and to commit weight loss. The fact that changes in all these areas will reduce or eliminate all together the development of diabetes is stressed. Updated lab needed at/ before next visit.   Diabetic Labs Latest Ref Rng & Units 03/21/2017 05/05/2016 10/21/2015 06/22/2015 01/07/2015  HbA1c <5.7 % of total Hgb 6.0(H) 5.8(H) 5.7(H) 5.9(H) 5.8(H)  Microalbumin 0.00 - 1.89 mg/dL - - - - -  Micro/Creat Ratio 0.0 - 30.0 mg/g - - - - -  Chol <200 mg/dL 163 - - 151 -  HDL >50 mg/dL 53 - - 51 -  Calc LDL <130 mg/dL - - - 71 -  Triglycerides <150 mg/dL 137 - - 146 -  Creatinine 0.60 - 0.93 mg/dL 0.72 0.61 0.75 0.74 0.66   BP/Weight 05/17/2017 03/27/2017 11/21/2016 05/09/2016 01/25/2016 01/13/2016 5/85/2778  Systolic BP 242 353 614 431 540 086 761  Diastolic BP 68 74 72 64 76 72 70  Wt. (Lbs) 211 215 212.75 215 215.6 215 210  BMI 33.05 33.67 33.32 33.67 33.77 33.67 32.88   Foot/eye exam completion dates 03/16/2010 02/11/2010  Eye Exam - normal  Foot exam Order yes -  Foot Form Completion - -

## 2017-05-20 NOTE — Assessment & Plan Note (Signed)
Controlled, no change in medication  

## 2017-05-20 NOTE — Assessment & Plan Note (Signed)
Improved, pt applauded on this and encouraged to continue to reduce intake of sweets and carbs and increase intake of vegetables and fruit and water and re commit to regular exercise when able

## 2017-05-29 ENCOUNTER — Ambulatory Visit (HOSPITAL_COMMUNITY)
Admission: RE | Admit: 2017-05-29 | Discharge: 2017-05-29 | Disposition: A | Discharge status: Home / Self Care | Payer: Medicare Other | Source: Ambulatory Visit | Attending: Family Medicine | Admitting: Family Medicine

## 2017-05-29 DIAGNOSIS — D174 Benign lipomatous neoplasm of intrathoracic organs: Secondary | ICD-10-CM | POA: Diagnosis not present

## 2017-05-29 DIAGNOSIS — I700 Atherosclerosis of aorta: Secondary | ICD-10-CM | POA: Diagnosis not present

## 2017-05-29 DIAGNOSIS — R911 Solitary pulmonary nodule: Secondary | ICD-10-CM | POA: Diagnosis not present

## 2017-05-29 LAB — POCT I-STAT CREATININE: CREATININE: 0.7 mg/dL (ref 0.44–1.00)

## 2017-05-29 MED ORDER — IOPAMIDOL (ISOVUE-300) INJECTION 61%
75.0000 mL | Freq: Once | INTRAVENOUS | Status: AC | PRN
  Administered 2017-05-29: 75 mL via INTRAVENOUS

## 2017-07-25 ENCOUNTER — Ambulatory Visit: Payer: Medicare Other | Admitting: Family Medicine

## 2017-07-25 ENCOUNTER — Encounter: Payer: Self-pay | Admitting: Family Medicine

## 2017-07-25 VITALS — BP 122/70 | HR 78 | Resp 16 | Ht 67.0 in | Wt 218.4 lb

## 2017-07-25 DIAGNOSIS — J3089 Other allergic rhinitis: Secondary | ICD-10-CM

## 2017-07-25 DIAGNOSIS — R7301 Impaired fasting glucose: Secondary | ICD-10-CM

## 2017-07-25 DIAGNOSIS — Z1231 Encounter for screening mammogram for malignant neoplasm of breast: Secondary | ICD-10-CM | POA: Diagnosis not present

## 2017-07-25 DIAGNOSIS — I10 Essential (primary) hypertension: Secondary | ICD-10-CM | POA: Diagnosis not present

## 2017-07-25 DIAGNOSIS — E669 Obesity, unspecified: Secondary | ICD-10-CM | POA: Diagnosis not present

## 2017-07-25 NOTE — Patient Instructions (Addendum)
Wellness with MD ( [place]) in 4 months, call if you need  Me before  Please  sched her mammogram at checkout July 19 or after  Non fasting labs this week Friday please  Blood pressure is excellent  Thank you  for choosing [facility]. We consider it a privelige to serve you.  Delivering excellent health care in a caring and  compassionate way is our goal.  Partnering with you,  so that together we can achieve this goal is our strategy.

## 2017-07-28 ENCOUNTER — Telehealth: Payer: Self-pay | Admitting: Family Medicine

## 2017-07-28 LAB — BASIC METABOLIC PANEL WITH GFR
BUN: 24 mg/dL (ref 7–25)
CO2: 32 mmol/L (ref 20–32)
Calcium: 9.5 mg/dL (ref 8.6–10.4)
Chloride: 102 mmol/L (ref 98–110)
Creat: 0.84 mg/dL (ref 0.60–0.93)
GFR, EST AFRICAN AMERICAN: 77 mL/min/{1.73_m2} (ref >=60)
GFR, EST NON AFRICAN AMERICAN: 66 mL/min/{1.73_m2} (ref >=60)
Glucose, Bld: 104 mg/dL — ABNORMAL HIGH (ref 65–99)
POTASSIUM: 4.2 mmol/L (ref 3.5–5.3)
SODIUM: 137 mmol/L (ref 135–146)

## 2017-07-28 LAB — CBC
HEMATOCRIT: 36.4 % (ref 35.0–45.0)
Hemoglobin: 12.8 g/dL (ref 11.7–15.5)
MCH: 31.8 pg (ref 27.0–33.0)
MCHC: 35.2 g/dL (ref 32.0–36.0)
MCV: 90.5 fL (ref 80.0–100.0)
MPV: 11.5 fL (ref 7.5–12.5)
Platelets: 145 10*3/uL (ref 140–400)
RBC: 4.02 10*6/uL (ref 3.80–5.10)
RDW: 12.9 % (ref 11.0–15.0)
WBC: 5.8 10*3/uL (ref 3.8–10.8)

## 2017-07-28 LAB — TSH: TSH: 1.88 mIU/L (ref 0.40–4.50)

## 2017-07-28 LAB — HEMOGLOBIN A1C
Hgb A1c MFr Bld: 6.2 % of total Hgb — ABNORMAL HIGH (ref <5.7)
Mean Plasma Glucose: 131 (calc)
eAG (mmol/L): 7.3 (calc)

## 2017-07-28 LAB — VITAMIN D 25 HYDROXY (VIT D DEFICIENCY, FRACTURES): VIT D 25 HYDROXY: 44 ng/mL (ref 30–100)

## 2017-07-28 NOTE — Assessment & Plan Note (Signed)
Deteriorated Patient educated about the importance of limiting  Carbohydrate intake , the need to commit to daily physical activity for a minimum of 30 minutes , and to commit weight loss. The fact that changes in all these areas will reduce or eliminate all together the development of diabetes is stressed.   Diabetic Labs Latest Ref Rng & Units 07/27/2017 05/29/2017 03/21/2017 05/05/2016 10/21/2015  HbA1c <5.7 % of total Hgb 6.2(H) - 6.0(H) 5.8(H) 5.7(H)  Microalbumin 0.00 - 1.89 mg/dL - - - - -  Micro/Creat Ratio 0.0 - 30.0 mg/g - - - - -  Chol <200 mg/dL - - 163 - -  HDL >50 mg/dL - - 53 - -  Calc LDL mg/dL (calc) - - 86 - -  Triglycerides <150 mg/dL - - 137 - -  Creatinine 0.60 - 0.93 mg/dL 0.84 0.70 0.72 0.61 0.75   BP/Weight 07/25/2017 05/17/2017 03/27/2017 11/21/2016 05/09/2016 01/25/2016 3/90/3009  Systolic BP 233 007 622 633 354 562 563  Diastolic BP 70 68 74 72 64 76 72  Wt. (Lbs) 218.4 211 215 212.75 215 215.6 215  BMI 34.21 33.05 33.67 33.32 33.67 33.77 33.67   Foot/eye exam completion dates 03/16/2010 02/11/2010  Eye Exam - normal  Foot exam Order yes -  Foot Form Completion - -

## 2017-07-28 NOTE — Progress Notes (Signed)
Erika [name]     MRN: [id number]      DOB: May 21, 1935   HPI Erika [name] is here for follow up and re-evaluation of chronic medical conditions, medication management and she will have results available after her visit and will be called if abnormal    The PT denies any adverse reactions to current medications since the last visit.  C/o ongoing weight gain , states she will definitely work on this  ROS Denies recent fever or chills. Denies sinus pressure, nasal congestion, ear pain or sore throat. Denies chest congestion, productive cough or wheezing. Denies chest pains, palpitations and leg swelling Denies abdominal pain, nausea, vomiting,diarrhea or constipation.   Denies dysuria, frequency, hesitancy or incontinence. Denies joint pain, swelling and limitation in mobility. Denies headaches, seizures, numbness, or tingling. Denies depression, anxiety or insomnia. Denies skin break down or rash.   PE  BP 122/70   Pulse 78   Resp 16   Ht 5\' 7"  (1.702 m)   Wt 218 lb 6.4 oz (99.1 kg)   BMI 34.21 kg/m   Patient alert and oriented and in no cardiopulmonary distress.  HEENT: No facial asymmetry, EOMI,   oropharynx pink and moist.  Neck supple no JVD, no mass.  Chest: Clear to auscultation bilaterally.  CVS: S1, S2 no murmurs, no S3.Regular rate.  ABD: Soft non tender.   Ext: No edema  MS: Decreased though Adequate ROM spine, shoulders, hips and knees.  Skin: Intact, no ulcerations or rash noted.  Psych: Good eye contact, normal affect. Memory intact not anxious or depressed appearing.  CNS: CN 2-12 intact, power,  normal throughout.no focal deficits noted.   Assessment & Plan  Essential hypertension Controlled, need to change medication due to recent recall DASH diet and commitment to daily physical activity for a minimum of 30 minutes discussed and encouraged, as a part of hypertension management. The importance of attaining a healthy weight is also  discussed.  BP/Weight 07/25/2017 05/17/2017 03/27/2017 11/21/2016 05/09/2016 01/25/2016 10/12/4313  Systolic BP 400 867 619 509 326 712 458  Diastolic BP 70 68 74 72 64 76 72  Wt. (Lbs) 218.4 211 215 212.75 215 215.6 215  BMI 34.21 33.05 33.67 33.32 33.67 33.77 33.67       Obesity (BMI 30.0-34.9) Deteriorated. Patient re-educated about  the importance of commitment to a  minimum of 150 minutes of exercise per week.  The importance of healthy food choices with portion control discussed. Encouraged to  consider  joining a support group. . Goals set by the patient for the next several months.   Weight /BMI 07/25/2017 05/17/2017 03/27/2017  WEIGHT 218 lb 6.4 oz 211 lb 215 lb  HEIGHT 5\' 7"  5\' 7"  5\' 7"   BMI 34.21 kg/m2 33.05 kg/m2 33.67 kg/m2      IMPAIRED FASTING GLUCOSE Deteriorated Patient educated about the importance of limiting  Carbohydrate intake , the need to commit to daily physical activity for a minimum of 30 minutes , and to commit weight loss. The fact that changes in all these areas will reduce or eliminate all together the development of diabetes is stressed.   Diabetic Labs Latest Ref Rng & Units 07/27/2017 05/29/2017 03/21/2017 05/05/2016 10/21/2015  HbA1c <5.7 % of total Hgb 6.2(H) - 6.0(H) 5.8(H) 5.7(H)  Microalbumin 0.00 - 1.89 mg/dL - - - - -  Micro/Creat Ratio 0.0 - 30.0 mg/g - - - - -  Chol <200 mg/dL - - 163 - -  HDL >50 mg/dL - -  53 - -  Calc LDL mg/dL (calc) - - 86 - -  Triglycerides <150 mg/dL - - 137 - -  Creatinine 0.60 - 0.93 mg/dL 0.84 0.70 0.72 0.61 0.75   BP/Weight 07/25/2017 05/17/2017 03/27/2017 11/21/2016 05/09/2016 01/25/2016 4/58/5929  Systolic BP 244 628 638 177 116 579 038  Diastolic BP 70 68 74 72 64 76 72  Wt. (Lbs) 218.4 211 215 212.75 215 215.6 215  BMI 34.21 33.05 33.67 33.32 33.67 33.77 33.67   Foot/eye exam completion dates 03/16/2010 02/11/2010  Eye Exam - normal  Foot exam Order yes -  Foot Form Completion - -      Allergic rhinitis No  current flare, not on medication currently

## 2017-07-28 NOTE — Assessment & Plan Note (Signed)
Deteriorated. Patient re-educated about  the importance of commitment to a  minimum of 150 minutes of exercise per week.  The importance of healthy food choices with portion control discussed. Encouraged to  consider  joining a support group. . Goals set by the patient for the next several months.   Weight /BMI 07/25/2017 05/17/2017 03/27/2017  WEIGHT 218 lb 6.4 oz 211 lb 215 lb  HEIGHT 5\' 7"  5\' 7"  5\' 7"   BMI 34.21 kg/m2 33.05 kg/m2 33.67 kg/m2

## 2017-07-28 NOTE — Assessment & Plan Note (Signed)
No current flare, not on medication currently

## 2017-07-28 NOTE — Telephone Encounter (Signed)
Pls call pt, she is being changed from losartan/HCTZ to olmesartan/ HCTZ due to national recall of losartan. I did not discuss  this during the visit and should have , so need to do this after the visit If she has questions or concerns please let me know. Pls also notify the pharmacy and send in the new script which I entered . You may review her labs when you call her also , I have entered a result note  ?? pls ask

## 2017-07-28 NOTE — Assessment & Plan Note (Addendum)
Controlled, need to change medication due to recent recall DASH diet and commitment to daily physical activity for a minimum of 30 minutes discussed and encouraged, as a part of hypertension management. The importance of attaining a healthy weight is also discussed.  BP/Weight 07/25/2017 05/17/2017 03/27/2017 11/21/2016 05/09/2016 01/25/2016 4/49/7530  Systolic BP 051 102 111 735 670 141 030  Diastolic BP 70 68 74 72 64 76 72  Wt. (Lbs) 218.4 211 215 212.75 215 215.6 215  BMI 34.21 33.05 33.67 33.32 33.67 33.77 33.67

## 2017-07-30 NOTE — Telephone Encounter (Signed)
Per dr Moshe Cipro, disregard previous message sent

## 2017-07-31 ENCOUNTER — Encounter: Payer: Self-pay | Admitting: Family Medicine

## 2017-08-02 ENCOUNTER — Other Ambulatory Visit: Payer: Self-pay | Admitting: Family Medicine

## 2017-09-13 ENCOUNTER — Other Ambulatory Visit: Payer: Self-pay | Admitting: Family Medicine

## 2017-09-18 NOTE — Telephone Encounter (Signed)
Patient states she has not received her blood pressure medication  Cb#: [phone]/ [phone] [facility] [place]

## 2017-09-21 ENCOUNTER — Telehealth: Payer: Self-pay | Admitting: Family Medicine

## 2017-09-21 ENCOUNTER — Other Ambulatory Visit: Payer: Self-pay

## 2017-09-21 MED ORDER — LOSARTAN POTASSIUM 50 MG PO TABS: 50.0000 mg | ORAL_TABLET | Freq: Every day | ORAL | 0 refills | Status: DC

## 2017-09-21 MED ORDER — HYDROCHLOROTHIAZIDE 12.5 MG PO CAPS: 12.5000 mg | ORAL_CAPSULE | Freq: Every day | ORAL | 0 refills | Status: DC

## 2017-09-21 NOTE — Telephone Encounter (Signed)
Please call Losartan in to [facility] [address]

## 2017-09-21 NOTE — Telephone Encounter (Signed)
Med split into 2 rx's per pharmacy

## 2017-11-19 ENCOUNTER — Ambulatory Visit (HOSPITAL_COMMUNITY): Payer: Medicare Other

## 2017-11-19 ENCOUNTER — Ambulatory Visit (HOSPITAL_COMMUNITY)
Admission: RE | Admit: 2017-11-19 | Discharge: 2017-11-19 | Disposition: A | Discharge status: Home / Self Care | Payer: Medicare Other | Source: Ambulatory Visit | Attending: Family Medicine | Admitting: Family Medicine

## 2017-11-19 ENCOUNTER — Other Ambulatory Visit: Payer: Self-pay | Admitting: Family Medicine

## 2017-11-19 DIAGNOSIS — Z1231 Encounter for screening mammogram for malignant neoplasm of breast: Secondary | ICD-10-CM

## 2017-11-26 ENCOUNTER — Encounter: Payer: Self-pay | Admitting: Family Medicine

## 2017-11-26 ENCOUNTER — Telehealth: Payer: Self-pay

## 2017-11-26 ENCOUNTER — Ambulatory Visit (INDEPENDENT_AMBULATORY_CARE_PROVIDER_SITE_OTHER): Payer: Medicare Other | Admitting: Family Medicine

## 2017-11-26 VITALS — BP 108/60 | HR 97 | Temp 98.1°F | Resp 14 | Ht 67.0 in | Wt 204.1 lb

## 2017-11-26 DIAGNOSIS — Z0000 Encounter for general adult medical examination without abnormal findings: Secondary | ICD-10-CM

## 2017-11-26 DIAGNOSIS — I10 Essential (primary) hypertension: Secondary | ICD-10-CM

## 2017-11-26 DIAGNOSIS — R7301 Impaired fasting glucose: Secondary | ICD-10-CM

## 2017-11-26 MED ORDER — LOSARTAN POTASSIUM-HCTZ 50-12.5 MG PO TABS
1.0000 | ORAL_TABLET | Freq: Every day | ORAL | 3 refills | Status: DC
Start: 2017-11-26 — End: 2019-01-17

## 2017-11-26 MED ORDER — LOSARTAN POTASSIUM-HCTZ 50-12.5 MG PO TABS: 1.0000 | ORAL_TABLET | Freq: Every day | ORAL | 3 refills | Status: DC

## 2017-11-26 NOTE — Telephone Encounter (Signed)
Labs ordered.

## 2017-11-26 NOTE — Progress Notes (Signed)
Preventive Screening-Counseling & Management   Patient present here today for a Medicare annual wellness visit.   Current Problems (verified)   Medications Prior to Visit Allergies (verified)   PAST HISTORY  Family History  Social History Widow, , niece named as power of attorney  Legally, Marcy Salvo patient has no children, she has no living will    Risk Factors  Current exercise habits:  3 times per week  Dietary issues discussed:low carb rich in vegetable and fruit   Cardiac risk factors: hypertension, obesity  Depression Screen  (Note: if answer to either of the following is "Yes", a more complete depression screening is indicated)   Over the past two weeks, have you felt down, depressed or hopeless? No  Over the past two weeks, have you felt little interest or pleasure in doing things? No  Have you lost interest or pleasure in daily life? No  Do you often feel hopeless? No  Do you cry easily over simple problems? No   Activities of Daily Living  In your present state of health, do you have any difficulty performing the following activities?  Driving?: No Managing money?: No Feeding yourself?:No Getting from bed to chair?:No Climbing a flight of stairs?:No Preparing food and eating?:No Bathing or showering?:No Getting dressed?:No Getting to the toilet?:No Using the toilet?:No Moving around from place to place?: No  Fall Risk Assessment In the past year have you fallen or had a near fall?:No Are you currently taking any medications that make you dizzy?:No   Hearing Difficulties: No Do you often ask people to speak up or repeat themselves?:No Do you experience ringing or noises in your ears?:No Do you have difficulty understanding soft or whispered voices?:No  Cognitive Testing  Alert? Yes Normal Appearance?Yes  Oriented to person? Yes Place? Yes  Time? Yes  Displays appropriate judgment?Yes  Can read the correct time from a watch face? yes Are you  having problems remembering things?No  Advanced Directives have been discussed with the patient?Yes , needs living will   List the Names of Other Physician/Practitioners you currently use:    Indicate any recent Medical Services you may have received from other than [facility] providers in the past year (date may be approximate).     Medicare Attestation  I have personally reviewed:  The patient's medical and social history  Their use of alcohol, tobacco or illicit drugs  Their current medications and supplements  The patient's functional ability including ADLs,fall risks, home safety risks, cognitive, and hearing and visual impairment  Diet and physical activities  Evidence for depression or mood disorders  The patient's weight, height, BMI, and visual acuity have been recorded in the chart. I have made referrals, counseling, and provided education to the patient based on review of the above and I have provided the patient with a written personalized care plan for preventive services.    Physical Exam BP 108/60 (BP Location: Left Arm, Patient Position: Sitting, Cuff Size: Normal)   Pulse 97   Temp 98.1 F (36.7 C) (Oral)   Resp 14   Ht 5\' 7"  (1.702 m)   Wt 204 lb 1.3 oz (92.6 kg)   SpO2 98%   BMI 31.96 kg/m    Assessment & Plan:  Medicare annual wellness visit, subsequent Annual wellness  exam as documented. Counseling done  re healthy lifestyle involving commitment to 150 minutes exercise per week, heart healthy diet, and attaining healthy weight.The importance of adequate sleep also discussed. Regular seat belt use and  home safety, is also discussed. Changes in health habits are decided on by the patient with goals and time frames  set for achieving them.

## 2017-11-26 NOTE — Assessment & Plan Note (Addendum)
Annual wellness  exam as documented. Counseling done  re healthy lifestyle involving commitment to 150 minutes exercise per week, heart healthy diet, and attaining healthy weight.The importance of adequate sleep also discussed. Regular seat belt use and home safety, is also discussed. Changes in health habits are decided on by the patient with goals and time frames  set for achieving them. Patient needs a living will.

## 2017-11-26 NOTE — Progress Notes (Signed)
Subjective:   Erika [name] is a 82 y.o. female who presents for Medicare Annual (Subsequent) preventive examination.  Review of Systems:         Objective:     Vitals: BP 108/60 (BP Location: Left Arm, Patient Position: Sitting, Cuff Size: Normal)   Pulse 97   Temp 98.1 F (36.7 C) (Oral)   Resp 14   Ht 5\' 7"  (1.702 m)   Wt 204 lb 1.3 oz (92.6 kg)   SpO2 98%   BMI 31.96 kg/m   Body mass index is 31.96 kg/m.  Advanced Directives 11/21/2016  Type of Advance Directive Living will;Healthcare Power of [place] in Chart? No - copy requested    Tobacco Social History   Tobacco Use  Smoking Status Never Smoker  Smokeless Tobacco Never Used  Tobacco Comment   Never smoked     Counseling given: Yes Comment: Never smoked   Clinical Intake:           BMI - recorded: 32           Past Medical History:  Diagnosis Date  . Allergy   . Diabetes mellitus, type 2 ([place])    normglycmic off meds since approx 2010  . Hypertension   . Obesity, mild   . Vertigo    Past Surgical History:  Procedure Laterality Date  . ABDOMINAL HYSTERECTOMY    . TOTAL ABDOMINAL HYSTERECTOMY W/ BILATERAL SALPINGOOPHORECTOMY     Family History  Problem Relation Age of Onset  . Cancer Mother 36       cervical   . Diabetes Sister   . Diabetes Sister   . Diabetes Sister   . Diabetes Sister   . Heart disease Sister   . Diabetes Brother   . Diabetes Brother   . Heart disease Brother   . Diabetes Brother   . Diabetes Brother   . Diabetes Brother   . Diabetes Sister   . Diabetes Sister   . Diabetes Sister   . Colon cancer Neg Hx    Social History   Socioeconomic History  . Marital status: Widowed    Spouse name: Not on file  . Number of children: Not on file  . Years of education: Not on file  . Highest education level: Not on file  Occupational History  . Not on file  Social Needs  . Financial resource strain: Not on file    . Food insecurity:    Worry: Not on file    Inability: Not on file  . Transportation needs:    Medical: Not on file    Non-medical: Not on file  Tobacco Use  . Smoking status: Never Smoker  . Smokeless tobacco: Never Used  . Tobacco comment: Never smoked  Substance and Sexual Activity  . Alcohol use: No    Alcohol/week: 0.0 oz  . Drug use: No  . Sexual activity: Not Currently  Lifestyle  . Physical activity:    Days per week: Not on file    Minutes per session: Not on file  . Stress: Not on file  Relationships  . Social connections:    Talks on phone: Not on file    Gets together: Not on file    Attends religious service: Not on file    Active member of club or organization: Not on file    Attends meetings of clubs or organizations: Not on file    Relationship status: Not on  file  Other Topics Concern  . Not on file  Social History Narrative  . Not on file    Outpatient Encounter Medications as of 11/26/2017  Medication Sig  . aspirin (ANACIN) 81 MG EC tablet Take 81 mg by mouth daily. Take one tablet by mouth daily     . bisacodyl (DULCOLAX) 5 MG EC tablet Take 5 mg by mouth as needed for moderate constipation.  . Calcium Carbonate-Vitamin D (CALCIUM 600/VITAMIN D) 600-400 MG-UNIT per chew tablet Chew 1 tablet by mouth daily. Take one tablet by mouth two times a day   . cetirizine (ZYRTEC HIVES RELIEF) 10 MG tablet Take 10 mg by mouth daily.    Marland Kitchen docusate sodium (COLACE) 100 MG capsule Take 100 mg by mouth 2 (two) times daily.  . hydrochlorothiazide (MICROZIDE) 12.5 MG capsule Take 1 capsule (12.5 mg total) by mouth daily.  Marland Kitchen losartan (COZAAR) 50 MG tablet Take 1 tablet (50 mg total) by mouth daily.  Marland Kitchen losartan-hydrochlorothiazide (HYZAAR) 50-12.5 MG tablet Take 1 tablet by mouth daily.  . Multiple Vitamins-Minerals (CENTRUM SILVER) tablet Take 1 tablet by mouth daily. One tablet by mouth once daily   . Omega-3 Fatty Acids (FISH OIL) 1000 MG CAPS Take 1 capsule by  mouth daily.   No facility-administered encounter medications on file as of 11/26/2017.     Activities of Daily Living No flowsheet data found.  Patient Care Team: Fayrene Helper, MD as PCP - General Caprice Beaver, DPM as Consulting Physician (Podiatry) Gala Romney, Cristopher Estimable, MD as Consulting Physician (Gastroenterology)    Assessment:   This is a routine wellness examination for Erika [name].  Exercise Activities and Dietary recommendations    Goals    None      Fall Risk Fall Risk  11/26/2017 07/25/2017 05/17/2017 11/21/2016 05/09/2016  Falls in the past year? No Yes No Yes No  Number falls in past yr: - 1 - 1 -  Injury with Fall? - - - Yes -  Risk for fall due to : - - - Other (Comment) -  Follow up - - - Falls evaluation completed -   Is the patient's home free of loose throw rugs in walkways, pet beds, electrical cords, etc?   yes      Grab bars in the bathroom? yes      Handrails on the stairs?   no      Adequate lighting?   yes  Timed Get Up and Go performed: no  Depression Screen PHQ 2/9 Scores 11/26/2017 05/17/2017 05/09/2016 01/12/2015  PHQ - 2 Score 0 0 0 0     Cognitive Function        Immunization History  Administered Date(s) Administered  . H1N1 03/09/2008  . Influenza Split 01/30/2012  . Influenza Whole 01/29/2009, 02/17/2009, 01/29/2010  . Influenza,inj,Quad PF,6+ Mos 01/23/2013, 02/10/2014, 01/12/2015, 01/13/2016, 03/27/2017  . Pneumococcal Conjugate-13 08/04/2014  . Pneumococcal Polysaccharide-23 05/01/2000, 03/16/2009  . Td 01/08/2004  . Tdap 03/01/2011  . Zoster 10/16/2006    Qualifies for Shingles Vaccine?call your insurance company to see if this is a covered vaccine  Screening Tests Health Maintenance  Topic Date Due  . INFLUENZA VACCINE  11/29/2017  . HEMOGLOBIN A1C  01/27/2018  . TETANUS/TDAP  02/28/2021  . DEXA SCAN  Completed  . PNA vac Low Risk Adult  Completed    Cancer Screenings: Lung: Low Dose CT Chest recommended if Age  53-80 years, 30 pack-year currently smoking OR have quit w/in 15years. Patient does not  qualify. Breast:  Up to date on Mammogram? Yes   Up to date of Bone Density/Dexa? Yes Colorectal: Up to date  Additional Screenings: : Hepatitis C Screening: Needs to be scheduled     Plan:      I have personally reviewed and noted the following in the patient's chart:   . Medical and social history . Use of alcohol, tobacco or illicit drugs  . Current medications and supplements . Functional ability and status . Nutritional status . Physical activity . Advanced directives . List of other physicians . Hospitalizations, surgeries, and ER visits in previous 12 months . Vitals . Screenings to include cognitive, depression, and falls . Referrals and appointments  In addition, I have reviewed and discussed with patient certain preventive protocols, quality metrics, and best practice recommendations. A written personalized care plan for preventive services as well as general preventive health recommendations were provided to patient.     Tod Persia, [place]  11/26/2017

## 2017-11-26 NOTE — Patient Instructions (Signed)
Annual physical exam  First week in December  Keep up the great work  You may discontinue aspirin no longer recommended

## 2017-11-27 LAB — CBC
HCT: 36.8 % (ref 35.0–45.0)
Hemoglobin: 12.8 g/dL (ref 11.7–15.5)
MCH: 31.8 pg (ref 27.0–33.0)
MCHC: 34.8 g/dL (ref 32.0–36.0)
MCV: 91.3 fL (ref 80.0–100.0)
MPV: 11.1 fL (ref 7.5–12.5)
PLATELETS: 153 10*3/uL (ref 140–400)
RBC: 4.03 10*6/uL (ref 3.80–5.10)
RDW: 12.9 % (ref 11.0–15.0)
WBC: 5.4 10*3/uL (ref 3.8–10.8)

## 2017-11-27 LAB — HEMOGLOBIN A1C
Hgb A1c MFr Bld: 5.8 % of total Hgb — ABNORMAL HIGH (ref <5.7)
Mean Plasma Glucose: 120 (calc)
eAG (mmol/L): 6.6 (calc)

## 2017-11-27 LAB — BASIC METABOLIC PANEL
BUN: 14 mg/dL (ref 7–25)
CHLORIDE: 100 mmol/L (ref 98–110)
CO2: 32 mmol/L (ref 20–32)
Calcium: 9.6 mg/dL (ref 8.6–10.4)
Creat: 0.64 mg/dL (ref 0.60–0.93)
GLUCOSE: 112 mg/dL (ref 65–139)
Potassium: 4.4 mmol/L (ref 3.5–5.3)
Sodium: 138 mmol/L (ref 135–146)

## 2017-12-15 ENCOUNTER — Other Ambulatory Visit: Payer: Self-pay | Admitting: Family Medicine

## 2018-03-11 ENCOUNTER — Ambulatory Visit: Payer: Medicare Other | Admitting: Family Medicine

## 2018-03-11 ENCOUNTER — Encounter: Payer: Self-pay | Admitting: Family Medicine

## 2018-03-11 VITALS — BP 134/64 | HR 68 | Resp 12 | Ht 66.0 in | Wt 212.0 lb

## 2018-03-11 DIAGNOSIS — E669 Obesity, unspecified: Secondary | ICD-10-CM | POA: Diagnosis not present

## 2018-03-11 DIAGNOSIS — J0120 Acute ethmoidal sinusitis, unspecified: Secondary | ICD-10-CM | POA: Diagnosis not present

## 2018-03-11 DIAGNOSIS — R7301 Impaired fasting glucose: Secondary | ICD-10-CM | POA: Diagnosis not present

## 2018-03-11 DIAGNOSIS — I10 Essential (primary) hypertension: Secondary | ICD-10-CM | POA: Diagnosis not present

## 2018-03-11 MED ORDER — AZITHROMYCIN 250 MG PO TABS
ORAL_TABLET | ORAL | 0 refills | Status: DC
Start: 2018-03-11 — End: 2018-04-03

## 2018-03-11 NOTE — Progress Notes (Signed)
   Erika [name]     MRN: [id number]      DOB: May 24, 1935   HPI Erika [name] is here with a 3 day h/o facial pressure, yellow bloody nasal drainage, no chills or fever/. Denies sore throat ear pain or productive cough ROS Denies recent fever or chills. Denies , ear pain or sore throat. Denies chest congestion, productive cough or wheezing. Denies chest pains, palpitations and leg swelling Denies abdominal pain, nausea, vomiting,diarrhea or constipation.   Denies dysuria, frequency, hesitancy or incontinence. Denies joint pain, swelling and limitation in mobility. Denies headaches, seizures, numbness, or tingling. Denies depression, anxiety or insomnia. Denies skin break down or rash.   PE  BP 134/64 (BP Location: Left Arm, Patient Position: Sitting, Cuff Size: Large)   Pulse 68   Resp 12   Ht 5\' 6"  (1.676 m)   Wt 212 lb (96.2 kg)   SpO2 98% Comment: room air  BMI 34.22 kg/m   Patient alert and oriented and in no cardiopulmonary distress.  HEENT: No facial asymmetry, EOMI,   oropharynx pink and moist.  Neck supple no JVD, no mass.Ethmoid sinus pressure  TM clear  Chest: Clear to auscultation bilaterally.  CVS: S1, S2 no murmurs, no S3.Regular rate.  ABD: Soft non tender.   Ext: No edema  MS: Adequate ROM spine, shoulders, hips and knees.  Skin: Intact, no ulcerations or rash noted.  Psych: Good eye contact, normal affect. Memory intact not anxious or depressed appearing.  CNS: CN 2-12 intact, power,  normal throughout.no focal deficits noted.   Assessment & Plan  Acute ethmoidal sinusitis Z pack prescribed  Essential hypertension Controlled, no change in medication DASH diet and commitment to daily physical activity for a minimum of 30 minutes discussed and encouraged, as a part of hypertension management. The importance of attaining a healthy weight is also discussed.  BP/Weight 03/11/2018 11/26/2017 07/25/2017 05/17/2017 03/27/2017 11/21/2016 09/03/8125    Systolic BP 517 001 749 449 675 916 384  Diastolic BP 64 60 70 68 74 72 64  Wt. (Lbs) 212 204.08 218.4 211 215 212.75 215  BMI 34.22 31.96 34.21 33.05 33.67 33.32 33.67       Obesity (BMI 30.0-34.9) Deteriorated. Patient re-educated about  the importance of commitment to a  minimum of 150 minutes of exercise per week.  The importance of healthy food choices with portion control discussed. Encouraged to start a food diary, count calories and to consider  joining a support group. Sample diet sheets offered. Goals set by the patient for the next several months.   Weight /BMI 03/11/2018 11/26/2017 07/25/2017  WEIGHT 212 lb 204 lb 1.3 oz 218 lb 6.4 oz  HEIGHT 5\' 6"  5\' 7"  5\' 7"   BMI 34.22 kg/m2 31.96 kg/m2 34.21 kg/m2

## 2018-03-11 NOTE — Assessment & Plan Note (Signed)
Controlled, no change in medication DASH diet and commitment to daily physical activity for a minimum of 30 minutes discussed and encouraged, as a part of hypertension management. The importance of attaining a healthy weight is also discussed.  BP/Weight 03/11/2018 11/26/2017 07/25/2017 05/17/2017 03/27/2017 08/03/5911 10/07/5990  Systolic BP 341 443 601 658 006 349 494  Diastolic BP 64 60 70 68 74 72 64  Wt. (Lbs) 212 204.08 218.4 211 215 212.75 215  BMI 34.22 31.96 34.21 33.05 33.67 33.32 33.67

## 2018-03-11 NOTE — Assessment & Plan Note (Signed)
Z pack prescribed 

## 2018-03-11 NOTE — Patient Instructions (Addendum)
Keep physical appt in December, call if you need me before  You are treated for acute sinusitis, 5 days of medication , is at your pharmacy Please collect lab order at checkout for fasting lipid, cmp and EGFr, HBA1c (around 03/27/2018)   Thanks for choosing [facility], we consider it a privelige to serve you.

## 2018-03-11 NOTE — Assessment & Plan Note (Signed)
Deteriorated. Patient re-educated about  the importance of commitment to a  minimum of 150 minutes of exercise per week.  The importance of healthy food choices with portion control discussed. Encouraged to start a food diary, count calories and to consider  joining a support group. Sample diet sheets offered. Goals set by the patient for the next several months.   Weight /BMI 03/11/2018 11/26/2017 07/25/2017  WEIGHT 212 lb 204 lb 1.3 oz 218 lb 6.4 oz  HEIGHT 5\' 6"  5\' 7"  5\' 7"   BMI 34.22 kg/m2 31.96 kg/m2 34.21 kg/m2

## 2018-03-28 LAB — COMPLETE METABOLIC PANEL WITH GFR
AG RATIO: 1.8 (calc) (ref 1.0–2.5)
ALT: 30 U/L — AB (ref 6–29)
AST: 28 U/L (ref 10–35)
Albumin: 4 g/dL (ref 3.6–5.1)
Alkaline phosphatase (APISO): 68 U/L (ref 33–130)
BILIRUBIN TOTAL: 0.7 mg/dL (ref 0.2–1.2)
BUN: 17 mg/dL (ref 7–25)
CO2: 30 mmol/L (ref 20–32)
Calcium: 9 mg/dL (ref 8.6–10.4)
Chloride: 103 mmol/L (ref 98–110)
Creat: 0.76 mg/dL (ref 0.60–0.93)
GFR, Est African American: 86 mL/min/{1.73_m2} (ref >=60)
GFR, Est Non African American: 75 mL/min/{1.73_m2} (ref >=60)
GLOBULIN: 2.2 g/dL (ref 1.9–3.7)
Glucose, Bld: 120 mg/dL — ABNORMAL HIGH (ref 65–99)
POTASSIUM: 4.1 mmol/L (ref 3.5–5.3)
Sodium: 141 mmol/L (ref 135–146)
Total Protein: 6.2 g/dL (ref 6.1–8.1)

## 2018-03-28 LAB — HEMOGLOBIN A1C
Hgb A1c MFr Bld: 6 % of total Hgb — ABNORMAL HIGH (ref <5.7)
Mean Plasma Glucose: 126 (calc)
eAG (mmol/L): 7 (calc)

## 2018-03-28 LAB — LIPID PANEL
CHOL/HDL RATIO: 2.7 (calc) (ref <5.0)
Cholesterol: 138 mg/dL (ref <200)
HDL: 52 mg/dL (ref >50)
LDL CHOLESTEROL (CALC): 69 mg/dL
NON-HDL CHOLESTEROL (CALC): 86 mg/dL (ref <130)
TRIGLYCERIDES: 89 mg/dL (ref <150)

## 2018-04-03 ENCOUNTER — Encounter: Payer: Self-pay | Admitting: Family Medicine

## 2018-04-03 ENCOUNTER — Ambulatory Visit (INDEPENDENT_AMBULATORY_CARE_PROVIDER_SITE_OTHER): Payer: Medicare Other | Admitting: Family Medicine

## 2018-04-03 VITALS — BP 140/64 | HR 70 | Resp 16 | Ht 66.0 in | Wt 212.0 lb

## 2018-04-03 DIAGNOSIS — Z23 Encounter for immunization: Secondary | ICD-10-CM | POA: Diagnosis not present

## 2018-04-03 DIAGNOSIS — Z780 Asymptomatic menopausal state: Secondary | ICD-10-CM

## 2018-04-03 DIAGNOSIS — R7301 Impaired fasting glucose: Secondary | ICD-10-CM

## 2018-04-03 DIAGNOSIS — I10 Essential (primary) hypertension: Secondary | ICD-10-CM

## 2018-04-03 DIAGNOSIS — Z0000 Encounter for general adult medical examination without abnormal findings: Secondary | ICD-10-CM | POA: Diagnosis not present

## 2018-04-03 NOTE — Progress Notes (Signed)
    Erika [name]     MRN: [id number]      DOB: [birth year]  HPI: Patient is in for annual physical exam. No other health concerns are expressed or addressed at the visit. Recent labs,  are reviewed. Immunization is reviewed , and  updated   PE: BP 140/64   Pulse 70   Resp 16   Ht 5\' 6"  (1.676 m)   Wt 212 lb (96.2 kg)   SpO2 99%   BMI 34.22 kg/m   Pleasant  female, alert and oriented x 3, in no cardio-pulmonary distress. Afebrile. HEENT No facial trauma or asymetry. Sinuses non tender.  Extra occullar muscles intact, pupils equally reactive to light. External ears normal, tympanic membranes clear. Oropharynx moist, no exudate. Neck: supple, no adenopathy,JVD or thyromegaly.No bruits.  Chest: Clear to ascultation bilaterally.No crackles or wheezes. Non tender to palpation  Breast: No asymetry,no masses or lumps. No tenderness. No nipple discharge or inversion. No axillary or supraclavicular adenopathy  Cardiovascular system; Heart sounds normal,  S1 and  S2 ,no S3.  No murmur, or thrill. Apical beat not displaced Peripheral pulses normal.  Abdomen: Soft, non tender, no organomegaly or masses. No bruits. Bowel sounds normal. No guarding, tenderness or rebound.    Musculoskeletal exam: Decreased  ROM of spine, hips , shoulders and knees. No deformity ,swelling or crepitus noted. No muscle wasting or atrophy.   Neurologic: Cranial nerves 2 to 12 intact. Power, tone ,sensation and reflexes normal throughout.  disturbance in gait. No tremor.  Skin: Intact, no ulceration, erythema , scaling or rash noted. Pigmentation normal throughout  Psych; Normal mood and affect. Judgement and concentration normal   Assessment & Plan:  Annual physical exam Annual exam as documented. Counseling done  re healthy lifestyle involving commitment to 150 minutes exercise per week, heart healthy diet, and attaining healthy weight.The importance of adequate sleep also  discussed.  Changes in health habits are decided on by the patient with goals and time frames  set for achieving them. Immunization and cancer screening needs are specifically addressed at this visit.   Need for influenza vaccination After obtaining informed consent, the vaccine is  administered

## 2018-04-03 NOTE — Patient Instructions (Addendum)
F/U in 4.  months, call if if you need me before  Flu vaccine today   Non fast hBA1C, cBC, tSH and Vit D ;ast week in March  It is important that you exercise regularly at least 30 minutes 5 times a week. If you develop chest pain, have severe difficulty breathing, or feel very tired, stop exercising immediately and seek medical attention

## 2018-04-07 ENCOUNTER — Encounter: Payer: Self-pay | Admitting: Family Medicine

## 2018-04-07 NOTE — Assessment & Plan Note (Signed)
After obtaining informed consent, the vaccine is  administered 

## 2018-04-07 NOTE — Assessment & Plan Note (Signed)
Annual exam as documented. Counseling done  re healthy lifestyle involving commitment to 150 minutes exercise per week, heart healthy diet, and attaining healthy weight.The importance of adequate sleep also discussed. Changes in health habits are decided on by the patient with goals and time frames  set for achieving them. Immunization and cancer screening needs are specifically addressed at this visit. 

## 2018-07-17 ENCOUNTER — Telehealth: Payer: Self-pay | Admitting: Family Medicine

## 2018-07-17 NOTE — Telephone Encounter (Signed)
Pt is calling in to advise  --Pt went to the grocery store on 3-17, and woke up this AM with a Sore throat, does she need to be seen?

## 2018-07-17 NOTE — Telephone Encounter (Signed)
Called pt and she states its gone now and it was just one side of her throat when she woke up and also had sinus drainage on that same side. Once she was up moving around it went away. Advised to take the zyrtec on her list and call back if any further symptoms or concerns

## 2018-07-30 LAB — TSH: TSH: 3.47 mIU/L (ref 0.40–4.50)

## 2018-07-30 LAB — VITAMIN D 25 HYDROXY (VIT D DEFICIENCY, FRACTURES): VIT D 25 HYDROXY: 36 ng/mL (ref 30–100)

## 2018-07-30 LAB — HEMOGLOBIN A1C
Hgb A1c MFr Bld: 6.3 % of total Hgb — ABNORMAL HIGH (ref <5.7)
Mean Plasma Glucose: 134 (calc)
eAG (mmol/L): 7.4 (calc)

## 2018-07-30 LAB — CBC
HCT: 36.9 % (ref 35.0–45.0)
Hemoglobin: 12.6 g/dL (ref 11.7–15.5)
MCH: 31.8 pg (ref 27.0–33.0)
MCHC: 34.1 g/dL (ref 32.0–36.0)
MCV: 93.2 fL (ref 80.0–100.0)
MPV: 11.3 fL (ref 7.5–12.5)
PLATELETS: 136 10*3/uL — AB (ref 140–400)
RBC: 3.96 10*6/uL (ref 3.80–5.10)
RDW: 12.9 % (ref 11.0–15.0)
WBC: 4.1 10*3/uL (ref 3.8–10.8)

## 2018-08-06 ENCOUNTER — Other Ambulatory Visit: Payer: Self-pay

## 2018-08-06 ENCOUNTER — Ambulatory Visit (INDEPENDENT_AMBULATORY_CARE_PROVIDER_SITE_OTHER): Payer: Medicare Other | Admitting: Family Medicine

## 2018-08-06 VITALS — BP 130/70 | Ht 66.0 in | Wt 212.0 lb

## 2018-08-06 DIAGNOSIS — R7303 Prediabetes: Secondary | ICD-10-CM

## 2018-08-06 DIAGNOSIS — Z1231 Encounter for screening mammogram for malignant neoplasm of breast: Secondary | ICD-10-CM | POA: Diagnosis not present

## 2018-08-06 DIAGNOSIS — I10 Essential (primary) hypertension: Secondary | ICD-10-CM | POA: Diagnosis not present

## 2018-08-06 DIAGNOSIS — E669 Obesity, unspecified: Secondary | ICD-10-CM | POA: Diagnosis not present

## 2018-08-06 DIAGNOSIS — J3089 Other allergic rhinitis: Secondary | ICD-10-CM

## 2018-08-06 NOTE — Progress Notes (Signed)
Virtual Visit via Telephone Note  I connected with Erika [name] on 08/06/18 at  9:40 AM EDT by telephone and verified that I am speaking with the correct person using two identifiers.   I discussed the limitations, risks, security and privacy concerns of performing an evaluation and management service by telephone and the availability of in person appointments. I also discussed with the patient that there may be a patient responsible charge related to this service. The patient expressed understanding and agreed to proceed. Pt is in her home and I am at my house. Visual contact is desired though not currently possible   History of Present Illness: F/u chronic problems and review of recent labs Denies recent fever or chills. Denies sinus pressure, nasal congestion, ear pain or sore throat. Denies chest congestion, productive cough or wheezing. Denies chest pains, palpitations and leg swelling Denies abdominal pain, nausea, vomiting,diarrhea or constipation.   Denies dysuria, frequency, hesitancy or incontinence. Denies uncontrolled joint pain, swelling and limitation in mobility. Denies headaches, seizures, numbness, or tingling. Denies depression, anxiety or insomnia. Denies skin break down or rash. States has been overeating food brought in which is not healthy, and has not been exercising as she should, intends to change this       Observations/Objective: BP 130/70   Ht 5\' 6"  (1.676 m)   Wt 212 lb (96.2 kg)   BMI 34.22 kg/m    Assessment and Plan:  Essential hypertension Controlled, no change in medication DASH diet and commitment to daily physical activity for a minimum of 30 minutes discussed and encouraged, as a part of hypertension management. The importance of attaining a healthy weight is also discussed.  BP/Weight 08/06/2018 04/03/2018 03/11/2018 11/26/2017 07/25/2017 05/17/2017 43/32/9518  Systolic BP 841 660 630 160 109 323 557  Diastolic BP 70 64 64 60 70 68 74   Wt. (Lbs) 212 212 212 204.08 218.4 211 215  BMI 34.22 34.22 34.22 31.96 34.21 33.05 33.67       Obesity (BMI 30.0-34.9)  Patient re-educated about  the importance of commitment to a  minimum of 150 minutes of exercise per week as able.  The importance of healthy food choices with portion control discussed, as well as eating regularly and within a 12 hour window most days. The need to choose "clean , green" food 50 to 75% of the time is discussed, as well as to make water the primary drink and set a goal of 64 ounces water daily.  Encouraged to start a food diary,  and to consider  joining a support group. Sample diet sheets offered. Goals set by the patient for the next several months.   Weight /BMI 08/06/2018 04/03/2018 03/11/2018  WEIGHT 212 lb 212 lb 212 lb  HEIGHT 5\' 6"  5\' 6"  5\' 6"   BMI 34.22 kg/m2 34.22 kg/m2 34.22 kg/m2      Prediabetes Deteriorated  Patient educated about the importance of limiting  Carbohydrate intake , the need to commit to daily physical activity for a minimum of 30 minutes , and to commit weight loss. The fact that changes in all these areas will reduce or eliminate all together the development of diabetes is stressed.   Diabetic Labs Latest Ref Rng & Units 07/29/2018 03/27/2018 11/26/2017 07/27/2017 05/29/2017  HbA1c <5.7 % of total Hgb 6.3(H) 6.0(H) 5.8(H) 6.2(H) -  Microalbumin 0.00 - 1.89 mg/dL - - - - -  Micro/Creat Ratio 0.0 - 30.0 mg/g - - - - -  Chol <200 mg/dL -  138 - - -  HDL >50 mg/dL - 52 - - -  Calc LDL mg/dL (calc) - 69 - - -  Triglycerides <150 mg/dL - 89 - - -  Creatinine 0.60 - 0.93 mg/dL - 0.76 0.64 0.84 0.70   BP/Weight 08/06/2018 04/03/2018 03/11/2018 11/26/2017 07/25/2017 05/17/2017 73/22/0254  Systolic BP 270 623 762 831 517 616 073  Diastolic BP 70 64 64 60 70 68 74  Wt. (Lbs) 212 212 212 204.08 218.4 211 215  BMI 34.22 34.22 34.22 31.96 34.21 33.05 33.67   Foot/eye exam completion dates 03/16/2010 02/11/2010  Eye Exam - normal   Foot exam Order yes -  Foot Form Completion - -      Allergic rhinitis Increased symptoms with season change, commit to daily zyrtec and limit pollen exposure   Follow Up Instructions:    I discussed the assessment and treatment plan with the patient. The patient was provided an opportunity to ask questions and all were answered. The patient agreed with the plan and demonstrated an understanding of the instructions.   The patient was advised to call back or seek an in-person evaluation if the symptoms worsen or if the condition fails to improve as anticipated.  I provided 22 minutes of non-face-to-face time during this encounter.   Tula Nakayama, MD

## 2018-08-06 NOTE — Patient Instructions (Addendum)
Wellness with MD ([facility]) and flu vaccine, mid to end August, call if you need me before  Mammogram is being scheduled for end July, appt info provided  Please work on cutting back bread and increasing vegetables, also walking regularly , 30 minutes total every day, so that your weight and blood sugar improve  Please get fasting cmp and EGFR and HBa1C 5 to 7 days before August visit  No changes in the medication you are taking Social distancing. Frequent hand washing with soap and water Keeping your hands off of your face. These 3 practices will help to keep both you and your community healthy during this time. Please practice them faithfully!   Thanks for choosing [facility], we consider it a privelige to serve you.

## 2018-08-07 ENCOUNTER — Encounter: Payer: Self-pay | Admitting: Family Medicine

## 2018-08-07 NOTE — Assessment & Plan Note (Signed)
Controlled, no change in medication DASH diet and commitment to daily physical activity for a minimum of 30 minutes discussed and encouraged, as a part of hypertension management. The importance of attaining a healthy weight is also discussed.  BP/Weight 08/06/2018 04/03/2018 03/11/2018 11/26/2017 07/25/2017 05/17/2017 33/58/2518  Systolic BP 984 210 [phone]  Diastolic BP 70 64 64 60 70 68 74  Wt. (Lbs) 212 212 212 204.08 218.4 211 215  BMI 34.22 34.22 34.22 31.96 34.21 33.05 33.67

## 2018-08-07 NOTE — Assessment & Plan Note (Signed)
  Patient re-educated about  the importance of commitment to a  minimum of 150 minutes of exercise per week as able.  The importance of healthy food choices with portion control discussed, as well as eating regularly and within a 12 hour window most days. The need to choose "clean , green" food 50 to 75% of the time is discussed, as well as to make water the primary drink and set a goal of 64 ounces water daily.  Encouraged to start a food diary,  and to consider  joining a support group. Sample diet sheets offered. Goals set by the patient for the next several months.   Weight /BMI 08/06/2018 04/03/2018 03/11/2018  WEIGHT 212 lb 212 lb 212 lb  HEIGHT 5\' 6"  5\' 6"  5\' 6"   BMI 34.22 kg/m2 34.22 kg/m2 34.22 kg/m2

## 2018-08-07 NOTE — Assessment & Plan Note (Signed)
Deteriorated  Patient educated about the importance of limiting  Carbohydrate intake , the need to commit to daily physical activity for a minimum of 30 minutes , and to commit weight loss. The fact that changes in all these areas will reduce or eliminate all together the development of diabetes is stressed.   Diabetic Labs Latest Ref Rng & Units 07/29/2018 03/27/2018 11/26/2017 07/27/2017 05/29/2017  HbA1c <5.7 % of total Hgb 6.3(H) 6.0(H) 5.8(H) 6.2(H) -  Microalbumin 0.00 - 1.89 mg/dL - - - - -  Micro/Creat Ratio 0.0 - 30.0 mg/g - - - - -  Chol <200 mg/dL - 138 - - -  HDL >50 mg/dL - 52 - - -  Calc LDL mg/dL (calc) - 69 - - -  Triglycerides <150 mg/dL - 89 - - -  Creatinine 0.60 - 0.93 mg/dL - 0.76 0.64 0.84 0.70   BP/Weight 08/06/2018 04/03/2018 03/11/2018 11/26/2017 07/25/2017 05/17/2017 73/42/8768  Systolic BP 115 726 203 559 741 638 453  Diastolic BP 70 64 64 60 70 68 74  Wt. (Lbs) 212 212 212 204.08 218.4 211 215  BMI 34.22 34.22 34.22 31.96 34.21 33.05 33.67   Foot/eye exam completion dates 03/16/2010 02/11/2010  Eye Exam - normal  Foot exam Order yes -  Foot Form Completion - -

## 2018-08-07 NOTE — Assessment & Plan Note (Signed)
Increased symptoms with season change, commit to daily zyrtec and limit pollen exposure

## 2018-11-25 ENCOUNTER — Other Ambulatory Visit: Payer: Self-pay

## 2018-11-25 ENCOUNTER — Encounter (HOSPITAL_COMMUNITY): Payer: Self-pay

## 2018-11-25 ENCOUNTER — Ambulatory Visit (HOSPITAL_COMMUNITY)
Admission: RE | Admit: 2018-11-25 | Discharge: 2018-11-25 | Disposition: A | Discharge status: Home / Self Care | Payer: Medicare Other | Source: Ambulatory Visit | Attending: Family Medicine | Admitting: Family Medicine

## 2018-11-25 DIAGNOSIS — Z1231 Encounter for screening mammogram for malignant neoplasm of breast: Secondary | ICD-10-CM | POA: Insufficient documentation

## 2018-12-18 LAB — COMPLETE METABOLIC PANEL WITH GFR
AG Ratio: 1.7 (calc) (ref 1.0–2.5)
ALT: 21 U/L (ref 6–29)
AST: 21 U/L (ref 10–35)
Albumin: 4 g/dL (ref 3.6–5.1)
Alkaline phosphatase (APISO): 66 U/L (ref 37–153)
BUN: 20 mg/dL (ref 7–25)
CO2: 29 mmol/L (ref 20–32)
Calcium: 9.4 mg/dL (ref 8.6–10.4)
Chloride: 102 mmol/L (ref 98–110)
Creat: 0.7 mg/dL (ref 0.60–0.88)
GFR, Est African American: 95 mL/min/{1.73_m2} (ref >=60)
GFR, Est Non African American: 82 mL/min/{1.73_m2} (ref >=60)
Globulin: 2.4 g/dL (calc) (ref 1.9–3.7)
Glucose, Bld: 127 mg/dL — ABNORMAL HIGH (ref 65–99)
Potassium: 4.2 mmol/L (ref 3.5–5.3)
Sodium: 138 mmol/L (ref 135–146)
Total Bilirubin: 0.6 mg/dL (ref 0.2–1.2)
Total Protein: 6.4 g/dL (ref 6.1–8.1)

## 2018-12-18 LAB — HEMOGLOBIN A1C
Hgb A1c MFr Bld: 5.9 % of total Hgb — ABNORMAL HIGH (ref <5.7)
Mean Plasma Glucose: 123 (calc)
eAG (mmol/L): 6.8 (calc)

## 2018-12-23 ENCOUNTER — Telehealth: Payer: Self-pay | Admitting: *Deleted

## 2018-12-23 NOTE — Telephone Encounter (Signed)
Please mail pt her lab results she had it done but never heard back from it and she said this could just be mailed

## 2018-12-23 NOTE — Telephone Encounter (Signed)
Mailed results  

## 2018-12-24 ENCOUNTER — Ambulatory Visit: Payer: Medicare Other | Admitting: Family Medicine

## 2018-12-27 ENCOUNTER — Other Ambulatory Visit: Payer: Self-pay

## 2018-12-27 ENCOUNTER — Encounter: Payer: Self-pay | Admitting: Family Medicine

## 2018-12-27 ENCOUNTER — Ambulatory Visit (INDEPENDENT_AMBULATORY_CARE_PROVIDER_SITE_OTHER): Payer: Medicare Other | Admitting: Family Medicine

## 2018-12-27 VITALS — BP 130/70 | HR 70 | Resp 16 | Ht 66.0 in | Wt 212.0 lb

## 2018-12-27 DIAGNOSIS — Z0000 Encounter for general adult medical examination without abnormal findings: Secondary | ICD-10-CM

## 2018-12-27 NOTE — Patient Instructions (Signed)
Ms. Bartosik , Thank you for taking time to come for your Medicare Wellness Visit. I appreciate your ongoing commitment to your health goals. Please review the following plan we discussed and let me know if I can assist you in the future.   Please continue to practice social distancing to keep you, your family, and our community safe.  If you must go out, please wear a Mask and practice good handwashing.  Screening recommendations/referrals: Colonoscopy: n/a Mammogram: up to date Bone Density: completed Recommended yearly ophthalmology/optometry visit for glaucoma screening and checkup Recommended yearly dental visit for hygiene and checkup  Vaccinations: Influenza vaccine: Due Fall-office to schedule _____________ Pneumococcal vaccine: Completed Tdap vaccine: Due 2022 Shingles vaccine: Completed  Advanced directives: You reported you have this  Conditions/risks identified: Fall  Next appointment: Schedule an appt for 3-4 months _____________    Preventive Care 81 Years and Older, Female Preventive care refers to lifestyle choices and visits with your health care provider that can promote health and wellness. What does preventive care include?  A yearly physical exam. This is also called an annual well check.  Dental exams once or twice a year.  Routine eye exams. Ask your health care provider how often you should have your eyes checked.  Personal lifestyle choices, including:  Daily care of your teeth and gums.  Regular physical activity.  Eating a healthy diet.  Avoiding tobacco and drug use.  Limiting alcohol use.  Practicing safe sex.  Taking low-dose aspirin every day.  Taking vitamin and mineral supplements as recommended by your health care provider. What happens during an annual well check? The services and screenings done by your health care provider during your annual well check will depend on your age, overall health, lifestyle risk factors, and family  history of disease. Counseling  Your health care provider may ask you questions about your:  Alcohol use.  Tobacco use.  Drug use.  Emotional well-being.  Home and relationship well-being.  Sexual activity.  Eating habits.  History of falls.  Memory and ability to understand (cognition).  Work and work Statistician.  Reproductive health. Screening  You may have the following tests or measurements:  Height, weight, and BMI.  Blood pressure.  Lipid and cholesterol levels. These may be checked every 5 years, or more frequently if you are over 21 years old.  Skin check.  Lung cancer screening. You may have this screening every year starting at age 81 if you have a 30-pack-year history of smoking and currently smoke or have quit within the past 15 years.  Fecal occult blood test (FOBT) of the stool. You may have this test every year starting at age 81.  Flexible sigmoidoscopy or colonoscopy. You may have a sigmoidoscopy every 5 years or a colonoscopy every 10 years starting at age 81.  Hepatitis C blood test.  Hepatitis B blood test.  Sexually transmitted disease (STD) testing.  Diabetes screening. This is done by checking your blood sugar (glucose) after you have not eaten for a while (fasting). You may have this done every 1-3 years.  Bone density scan. This is done to screen for osteoporosis. You may have this done starting at age 81.  Mammogram. This may be done every 1-2 years. Talk to your health care provider about how often you should have regular mammograms. Talk with your health care provider about your test results, treatment options, and if necessary, the need for more tests. Vaccines  Your health care provider may recommend certain  vaccines, such as:  Influenza vaccine. This is recommended every year.  Tetanus, diphtheria, and acellular pertussis (Tdap, Td) vaccine. You may need a Td booster every 10 years.  Zoster vaccine. You may need this after  age 81.  Pneumococcal 13-valent conjugate (PCV13) vaccine. One dose is recommended after age 81.  Pneumococcal polysaccharide (PPSV23) vaccine. One dose is recommended after age 81. Talk to your health care provider about which screenings and vaccines you need and how often you need them. This information is not intended to replace advice given to you by your health care provider. Make sure you discuss any questions you have with your health care provider. Document Released: 05/14/2015 Document Revised: 01/05/2016 Document Reviewed: 02/16/2015 Elsevier Interactive Patient Education  2017 [place] Prevention in the Home Falls can cause injuries. They can happen to people of all ages. There are many things you can do to make your home safe and to help prevent falls. What can I do on the outside of my home?  Regularly fix the edges of walkways and driveways and fix any cracks.  Remove anything that might make you trip as you walk through a door, such as a raised step or threshold.  Trim any bushes or trees on the path to your home.  Use bright outdoor lighting.  Clear any walking paths of anything that might make someone trip, such as rocks or tools.  Regularly check to see if handrails are loose or broken. Make sure that both sides of any steps have handrails.  Any raised decks and porches should have guardrails on the edges.  Have any leaves, snow, or ice cleared regularly.  Use sand or salt on walking paths during winter.  Clean up any spills in your garage right away. This includes oil or grease spills. What can I do in the bathroom?  Use night lights.  Install grab bars by the toilet and in the tub and shower. Do not use towel bars as grab bars.  Use non-skid mats or decals in the tub or shower.  If you need to sit down in the shower, use a plastic, non-slip stool.  Keep the floor dry. Clean up any water that spills on the floor as soon as it happens.   Remove soap buildup in the tub or shower regularly.  Attach bath mats securely with double-sided non-slip rug tape.  Do not have throw rugs and other things on the floor that can make you trip. What can I do in the bedroom?  Use night lights.  Make sure that you have a light by your bed that is easy to reach.  Do not use any sheets or blankets that are too big for your bed. They should not hang down onto the floor.  Have a firm chair that has side arms. You can use this for support while you get dressed.  Do not have throw rugs and other things on the floor that can make you trip. What can I do in the kitchen?  Clean up any spills right away.  Avoid walking on wet floors.  Keep items that you use a lot in easy-to-reach places.  If you need to reach something above you, use a strong step stool that has a grab bar.  Keep electrical cords out of the way.  Do not use floor polish or wax that makes floors slippery. If you must use wax, use non-skid floor wax.  Do not have throw rugs and other things  on the floor that can make you trip. What can I do with my stairs?  Do not leave any items on the stairs.  Make sure that there are handrails on both sides of the stairs and use them. Fix handrails that are broken or loose. Make sure that handrails are as long as the stairways.  Check any carpeting to make sure that it is firmly attached to the stairs. Fix any carpet that is loose or worn.  Avoid having throw rugs at the top or bottom of the stairs. If you do have throw rugs, attach them to the floor with carpet tape.  Make sure that you have a light switch at the top of the stairs and the bottom of the stairs. If you do not have them, ask someone to add them for you. What else can I do to help prevent falls?  Wear shoes that:  Do not have high heels.  Have rubber bottoms.  Are comfortable and fit you well.  Are closed at the toe. Do not wear sandals.  If you use a  stepladder:  Make sure that it is fully opened. Do not climb a closed stepladder.  Make sure that both sides of the stepladder are locked into place.  Ask someone to hold it for you, if possible.  Clearly mark and make sure that you can see:  Any grab bars or handrails.  First and last steps.  Where the edge of each step is.  Use tools that help you move around (mobility aids) if they are needed. These include:  Canes.  Walkers.  Scooters.  Crutches.  Turn on the lights when you go into a dark area. Replace any light bulbs as soon as they burn out.  Set up your furniture so you have a clear path. Avoid moving your furniture around.  If any of your floors are uneven, fix them.  If there are any pets around you, be aware of where they are.  Review your medicines with your doctor. Some medicines can make you feel dizzy. This can increase your chance of falling. Ask your doctor what other things that you can do to help prevent falls. This information is not intended to replace advice given to you by your health care provider. Make sure you discuss any questions you have with your health care provider. Document Released: 02/11/2009 Document Revised: 09/23/2015 Document Reviewed: 05/22/2014 Elsevier Interactive Patient Education  2017 [employer].

## 2018-12-27 NOTE — Progress Notes (Signed)
Subjective:   Erika [name] is a 83 y.o. female who presents for Medicare Annual (Subsequent) preventive examination.  Location of Patient: Home Location of Provider: Telehealth Consent was obtain for visit to be over via telehealth. I verified that I am speaking with the correct person using two identifiers.   Review of Systems:    Cardiac Risk Factors include: advanced age (>43men, >68 women);hypertension;obesity (BMI >30kg/m2)     Objective:     Vitals: BP 130/70   Pulse 70   Resp 16   Ht 5\' 6"  (1.676 m)   Wt 212 lb (96.2 kg)   BMI 34.22 kg/m   Body mass index is 34.22 kg/m.  Advanced Directives 11/21/2016  Type of Advance Directive Living will;Healthcare Power of [place] in Chart? No - copy requested    Tobacco Social History   Tobacco Use  Smoking Status Never Smoker  Smokeless Tobacco Never Used  Tobacco Comment   Never smoked     Counseling given: Yes Comment: Never smoked   Clinical Intake:  Pre-visit preparation completed: Yes  Pain : No/denies pain Pain Score: 0-No pain     BMI - recorded: 34.22 Nutritional Status: BMI > 30  Obese Nutritional Risks: None Diabetes: No  How often do you need to have someone help you when you read instructions, pamphlets, or other written materials from your doctor or pharmacy?: 1 - Never What is the last grade level you completed in school?: 12  Interpreter Needed?: No     Past Medical History:  Diagnosis Date  . Allergy   . Diabetes mellitus, type 2 ([place])    normglycmic off meds since approx 2010  . Hypertension   . Obesity, mild   . Vertigo    Past Surgical History:  Procedure Laterality Date  . ABDOMINAL HYSTERECTOMY    . TOTAL ABDOMINAL HYSTERECTOMY W/ BILATERAL SALPINGOOPHORECTOMY     Family History  Problem Relation Age of Onset  . Cancer Mother 34       cervical   . Diabetes Sister   . Diabetes Sister   . Diabetes Sister   . Diabetes Sister    . Heart disease Sister   . Diabetes Brother   . Diabetes Brother   . Heart disease Brother   . Diabetes Brother   . Diabetes Brother   . Diabetes Brother   . Diabetes Sister   . Diabetes Sister   . Diabetes Sister   . Alzheimer's disease Sister   . Colon cancer Neg Hx    Social History   Socioeconomic History  . Marital status: Widowed    Spouse name: Not on file  . Number of children: Not on file  . Years of education: Not on file  . Highest education level: Not on file  Occupational History  . Not on file  Social Needs  . Financial resource strain: Not on file  . Food insecurity    Worry: Not on file    Inability: Not on file  . Transportation needs    Medical: Not on file    Non-medical: Not on file  Tobacco Use  . Smoking status: Never Smoker  . Smokeless tobacco: Never Used  . Tobacco comment: Never smoked  Substance and Sexual Activity  . Alcohol use: No    Alcohol/week: 0.0 standard drinks  . Drug use: No  . Sexual activity: Not Currently  Lifestyle  . Physical activity    Days per  week: Not on file    Minutes per session: Not on file  . Stress: Not on file  Relationships  . Social Herbalist on phone: Not on file    Gets together: Not on file    Attends religious service: Not on file    Active member of club or organization: Not on file    Attends meetings of clubs or organizations: Not on file    Relationship status: Not on file  Other Topics Concern  . Not on file  Social History Narrative  . Not on file    Outpatient Encounter Medications as of 12/27/2018  Medication Sig  . bisacodyl (DULCOLAX) 5 MG EC tablet Take 5 mg by mouth as needed for moderate constipation.  . Calcium Carbonate-Vitamin D (CALCIUM 600/VITAMIN D) 600-400 MG-UNIT per chew tablet Chew 1 tablet by mouth daily. Take one tablet by mouth two times a day   . cetirizine (ZYRTEC HIVES RELIEF) 10 MG tablet Take 10 mg by mouth daily.    Marland Kitchen docusate sodium (COLACE) 100 MG  capsule Take 100 mg by mouth 2 (two) times daily.  Marland Kitchen losartan-hydrochlorothiazide (HYZAAR) 50-12.5 MG tablet Take 1 tablet by mouth daily.  . Multiple Vitamins-Minerals (CENTRUM SILVER) tablet Take 1 tablet by mouth daily. One tablet by mouth once daily   . Omega-3 Fatty Acids (FISH OIL) 1000 MG CAPS Take 1 capsule by mouth daily.   No facility-administered encounter medications on file as of 12/27/2018.     Activities of Daily Living In your present state of health, do you have any difficulty performing the following activities: 12/27/2018  Hearing? N  Vision? N  Difficulty concentrating or making decisions? N  Walking or climbing stairs? N  Dressing or bathing? N  Doing errands, shopping? N  Preparing Food and eating ? N  Using the Toilet? N  In the past six months, have you accidently leaked urine? N  Do you have problems with loss of bowel control? N  Managing your Medications? N  Managing your Finances? N  Housekeeping or managing your Housekeeping? N  Some recent data might be hidden    Patient Care Team: Fayrene Helper, MD as PCP - General Caprice Beaver, DPM as Consulting Physician (Podiatry) Gala Romney, Cristopher Estimable, MD as Consulting Physician (Gastroenterology)    Assessment:   This is a routine wellness examination for Bobbe.  Exercise Activities and Dietary recommendations Current Exercise Habits: Home exercise routine, Type of exercise: treadmill, Time (Minutes): 20, Frequency (Times/Week): 7, Weekly Exercise (Minutes/Week): 140, Intensity: Moderate, Exercise limited by: None identified  Goals   None     Fall Risk Fall Risk  12/27/2018 04/03/2018 03/11/2018 11/26/2017 07/25/2017  Falls in the past year? 0 0 0 No Yes  Number falls in past yr: 0 - 0 - 1  Injury with Fall? 0 - 0 - -  Risk for fall due to : - - - - -  Follow up - - - - -   Is the patient's home free of loose throw rugs in walkways, pet beds, electrical cords, etc?   yes      Grab bars in the  bathroom? yes      Handrails on the stairs?   yes      Adequate lighting?   yes     Depression Screen PHQ 2/9 Scores 12/27/2018 08/07/2018 04/03/2018 03/11/2018  PHQ - 2 Score 1 0 0 0  PHQ- 9 Score - - - -  Cognitive Function     6CIT Screen 12/27/2018  What Year? 0 points  What month? 0 points  What time? 0 points  Count back from 20 0 points  Months in reverse 0 points  Repeat phrase 0 points  Total Score 0    Immunization History  Administered Date(s) Administered  . H1N1 03/09/2008  . Influenza Split 01/30/2012  . Influenza Whole 01/29/2009, 02/17/2009, 01/29/2010  . Influenza, High Dose Seasonal PF 04/03/2018  . Influenza,inj,Quad PF,6+ Mos 01/23/2013, 02/10/2014, 01/12/2015, 01/13/2016, 03/27/2017  . Pneumococcal Conjugate-13 08/04/2014  . Pneumococcal Polysaccharide-23 05/01/2000, 03/16/2009  . Td 01/08/2004  . Tdap 03/01/2011  . Zoster 10/16/2006    Qualifies for Shingles Vaccine? complete Screening Tests Health Maintenance  Topic Date Due  . INFLUENZA VACCINE  11/30/2018  . HEMOGLOBIN A1C  06/19/2019  . TETANUS/TDAP  02/28/2021  . DEXA SCAN  Completed  . PNA vac Low Risk Adult  Completed    Cancer Screenings: Lung: Low Dose CT Chest recommended if Age 59-80 years, 30 pack-year currently smoking OR have quit w/in 15years. Patient does not qualify. Breast:  Up to date on Mammogram? Yes   Up to date of Bone Density/Dexa? Yes Colorectal:  n/a  Additional Screenings:   Hepatitis C Screening: not check yet     Plan:        1. Encounter for Medicare annual wellness exam  I have personally reviewed and noted the following in the patient's chart:   . Medical and social history . Use of alcohol, tobacco or illicit drugs  . Current medications and supplements . Functional ability and status . Nutritional status . Physical activity . Advanced directives . List of other physicians . Hospitalizations, surgeries, and ER visits in previous 12 months  . Vitals . Screenings to include cognitive, depression, and falls . Referrals and appointments  In addition, I have reviewed and discussed with patient certain preventive protocols, quality metrics, and best practice recommendations. A written personalized care plan for preventive services as well as general preventive health recommendations were provided to patient.    I provided 20 minutes of non-face-to-face time during this encounter.   Perlie Mayo, NP  12/27/2018

## 2019-01-01 ENCOUNTER — Encounter: Payer: Medicare Other | Admitting: Family Medicine

## 2019-01-13 ENCOUNTER — Other Ambulatory Visit: Payer: Self-pay

## 2019-01-13 ENCOUNTER — Ambulatory Visit (INDEPENDENT_AMBULATORY_CARE_PROVIDER_SITE_OTHER): Payer: Medicare Other

## 2019-01-13 DIAGNOSIS — Z23 Encounter for immunization: Secondary | ICD-10-CM | POA: Diagnosis not present

## 2019-01-17 ENCOUNTER — Other Ambulatory Visit: Payer: Self-pay | Admitting: Family Medicine

## 2019-02-12 ENCOUNTER — Telehealth: Payer: Self-pay | Admitting: Family Medicine

## 2019-02-12 ENCOUNTER — Ambulatory Visit (INDEPENDENT_AMBULATORY_CARE_PROVIDER_SITE_OTHER): Payer: Medicare Other | Admitting: Family Medicine

## 2019-02-12 ENCOUNTER — Encounter: Payer: Self-pay | Admitting: Family Medicine

## 2019-02-12 ENCOUNTER — Other Ambulatory Visit: Payer: Self-pay

## 2019-02-12 VITALS — Ht 66.0 in | Wt 210.0 lb

## 2019-02-12 DIAGNOSIS — R1030 Lower abdominal pain, unspecified: Secondary | ICD-10-CM | POA: Diagnosis not present

## 2019-02-12 LAB — POCT URINALYSIS DIP (CLINITEK)
Bilirubin, UA: NEGATIVE
Blood, UA: NEGATIVE
Glucose, UA: NEGATIVE mg/dL
Ketones, POC UA: NEGATIVE mg/dL
Leukocytes, UA: NEGATIVE
Nitrite, UA: NEGATIVE
POC PROTEIN,UA: NEGATIVE
Spec Grav, UA: 1.01 (ref 1.010–1.025)
Urobilinogen, UA: 0.2 E.U./dL
pH, UA: 7 (ref 5.0–8.0)

## 2019-02-12 NOTE — Patient Instructions (Signed)
  I appreciate the opportunity to provide you with the care for your health and wellness. Today we discussed: Lower abdominal pain  Follow up: As previously scheduled on 04/16/2019  No labs or referrals today  Urine was negative for any signs of urinary tract infection at this time.  Secondary to having a recent GI bug possibly left over related discomfort in the abdominal/colon area.  Advised to follow-up with [place] if you start to have this discomfort and pain again.  Otherwise make sure that you are maintaining good hydration and eating a well-balanced diet.  Additionally maintain that you wipe front to back and practice good vaginal and restroom hygiene.  Please do not hesitate to reach back out if you have any other signs or symptoms that return.   It was a pleasure to see you and I look forward to continuing to work together on your health and well-being. Please do not hesitate to call the office if you need care or have questions about your care.  Have a wonderful day and week. With Gratitude, Cherly Beach, DNP, AGNP-BC

## 2019-02-12 NOTE — Progress Notes (Addendum)
Virtual Visit via Telephone Note   This visit type was conducted due to national recommendations for restrictions regarding the COVID-19 Pandemic (e.g. social distancing) in an effort to limit this patient's exposure and mitigate transmission in our community.  Due to her co-morbid illnesses, this patient is at least at moderate risk for complications without adequate follow up.  This format is felt to be most appropriate for this patient at this time.  The patient did not have access to video technology/had technical difficulties with video requiring transitioning to audio format only (telephone).  All issues noted in this document were discussed and addressed.  No physical exam could be performed with this format.    Evaluation Performed:  Follow-up visit  Date:  02/12/2019   ID:  Erika [name], DOB 1935-09-09, MRN [id number]  Patient Location: Home Provider Location: Office  Location of Patient: Home Location of Provider: Telehealth Consent was obtain for visit to be over via telehealth. I verified that I am speaking with the correct person using two identifiers.  PCP:  Fayrene Helper, MD   Chief Complaint: Abdominal pain  History of Present Illness:    Erika [name] is a 83 y.o. female with history of diabetes type 2, hypertension, obesity among others.  Presented today for phone visit secondary to having lower abdominal pain.  Was question whether or not she had a urinary tract infection beginning.  Reported that she did recently have a GI virus that caused her to have a little bit of diarrhea but not a lot.  She reports that that is subsided now.  She reports that the abdominal pain came on this morning.  And that is what she was worried about a urinary tract infection.  She denies having any modifying factors currently.  Denies having any issues or trouble now this afternoon.  Came by the office today to leave a sample for check.  She reports that the pain and  discomfort is underneath where she had a hysterectomy.  The patient does not have symptoms concerning for COVID-19 infection (fever, chills, cough, or new shortness of breath).   Past Medical, Surgical, Social History, Allergies, and Medications have been Reviewed.   Past Medical History:  Diagnosis Date  . Allergy   . Diabetes mellitus, type 2 ([place])    normglycmic off meds since approx 2010  . Hypertension   . Obesity, mild   . Vertigo    Past Surgical History:  Procedure Laterality Date  . ABDOMINAL HYSTERECTOMY    . TOTAL ABDOMINAL HYSTERECTOMY W/ BILATERAL SALPINGOOPHORECTOMY       Current Meds  Medication Sig  . bisacodyl (DULCOLAX) 5 MG EC tablet Take 5 mg by mouth as needed for moderate constipation.  . Calcium Carbonate-Vitamin D (CALCIUM 600/VITAMIN D) 600-400 MG-UNIT per chew tablet Chew 1 tablet by mouth daily. Take one tablet by mouth two times a day   . cetirizine (ZYRTEC HIVES RELIEF) 10 MG tablet Take 10 mg by mouth daily.    Marland Kitchen docusate sodium (COLACE) 100 MG capsule Take 100 mg by mouth 2 (two) times daily.  Marland Kitchen losartan-hydrochlorothiazide (HYZAAR) 50-12.5 MG tablet TAKE 1 TABLET BY MOUTH EVERY DAY  . Multiple Vitamins-Minerals (CENTRUM SILVER) tablet Take 1 tablet by mouth daily. One tablet by mouth once daily   . Omega-3 Fatty Acids (FISH OIL) 1000 MG CAPS Take 1 capsule by mouth daily.     Allergies:   Benazepril-hydrochlorothiazide, Gabapentin, and Restoril [temazepam]  Social History   Tobacco Use  . Smoking status: Never Smoker  . Smokeless tobacco: Never Used  . Tobacco comment: Never smoked  Substance Use Topics  . Alcohol use: No    Alcohol/week: 0.0 standard drinks  . Drug use: No     Family Hx: The patient's family history includes Alzheimer's disease in her sister; Cancer (age of onset: 29) in her mother; Diabetes in her brother, brother, brother, brother, brother, sister, sister, sister, sister, sister, sister, and sister; Heart disease in  her brother and sister. There is no history of Colon cancer.  ROS:   Please see the history of present illness.    All other systems reviewed and are negative.   Labs/Other Tests and Data Reviewed:    Recent Labs: 07/29/2018: Hemoglobin 12.6; Platelets 136; TSH 3.47 12/17/2018: ALT 21; BUN 20; Creat 0.70; Potassium 4.2; Sodium 138   Recent Lipid Panel Lab Results  Component Value Date/Time   CHOL 138 03/27/2018 08:33 AM   TRIG 89 03/27/2018 08:33 AM   HDL 52 03/27/2018 08:33 AM   CHOLHDL 2.7 03/27/2018 08:33 AM   LDLCALC 69 03/27/2018 08:33 AM    Wt Readings from Last 3 Encounters:  02/12/19 210 lb (95.3 kg)  12/27/18 212 lb (96.2 kg)  08/06/18 212 lb (96.2 kg)     Objective:    Vital Signs:  Ht 5\' 6"  (1.676 m)   Wt 210 lb (95.3 kg)   BMI 33.89 kg/m    GEN:  Alert and oriented RESPIRATORY:  No shortness of breath noted in conversation PSYCH:  Normal affect and mood  ASSESSMENT & PLAN:    1. Pain radiating to lower abdomen UA was negative today in the office.  After having phone conversation with patient patient omitted that she did have a recent GI bug.  Possibly related to lower abdominal discomfort and pain.  As she did have diarrhea several times.  But reports that that is much better now.  She also reports that the lower abdomen is not hurting as much.  Since she no longer has the abdominal discomfort or the diarrhea or infection in the urine noted.  We will continue to just watch.  Advised for her to call the office back if symptoms do return or if she has new onset of symptoms.   - POCT URINALYSIS DIP (CLINITEK)   Time:   Today, I have spent 10 minutes with the patient with telehealth technology discussing the above problems.     Medication Adjustments/Labs and Tests Ordered: Current medicines are reviewed at length with the patient today.  Concerns regarding medicines are outlined above.   Tests Ordered: Orders Placed This Encounter  Procedures  . POCT  URINALYSIS DIP (CLINITEK)    Medication Changes: No orders of the defined types were placed in this encounter.   Disposition:  Follow up 04/16/2019  Signed, Perlie Mayo, NP  02/12/2019 3:14 PM     [facility]

## 2019-02-12 NOTE — Telephone Encounter (Signed)
Patient came into the office and left urine sample. On the schedule for this afternoon

## 2019-02-12 NOTE — Telephone Encounter (Signed)
Please call the pt she is hurting in the bottom part of her stomach

## 2019-02-12 NOTE — Telephone Encounter (Signed)
Pt calling back requesting a call back

## 2019-02-12 NOTE — Telephone Encounter (Signed)
Tried to call patient. No answer. When I tried to leave a message the machine cut me off and said memory full. Will try again later.

## 2019-03-25 IMAGING — MG 2D DIGITAL SCREENING BILATERAL MAMMOGRAM WITH CAD AND ADJUNCT TO
6 of 10 series · 6 of 26 positions shown · non-contrast
Comparison: Previous exam(s).

CLINICAL DATA: Screening.

EXAM:
2D DIGITAL SCREENING BILATERAL MAMMOGRAM WITH CAD AND ADJUNCT TOMO

[L MLO (1 of 2)]
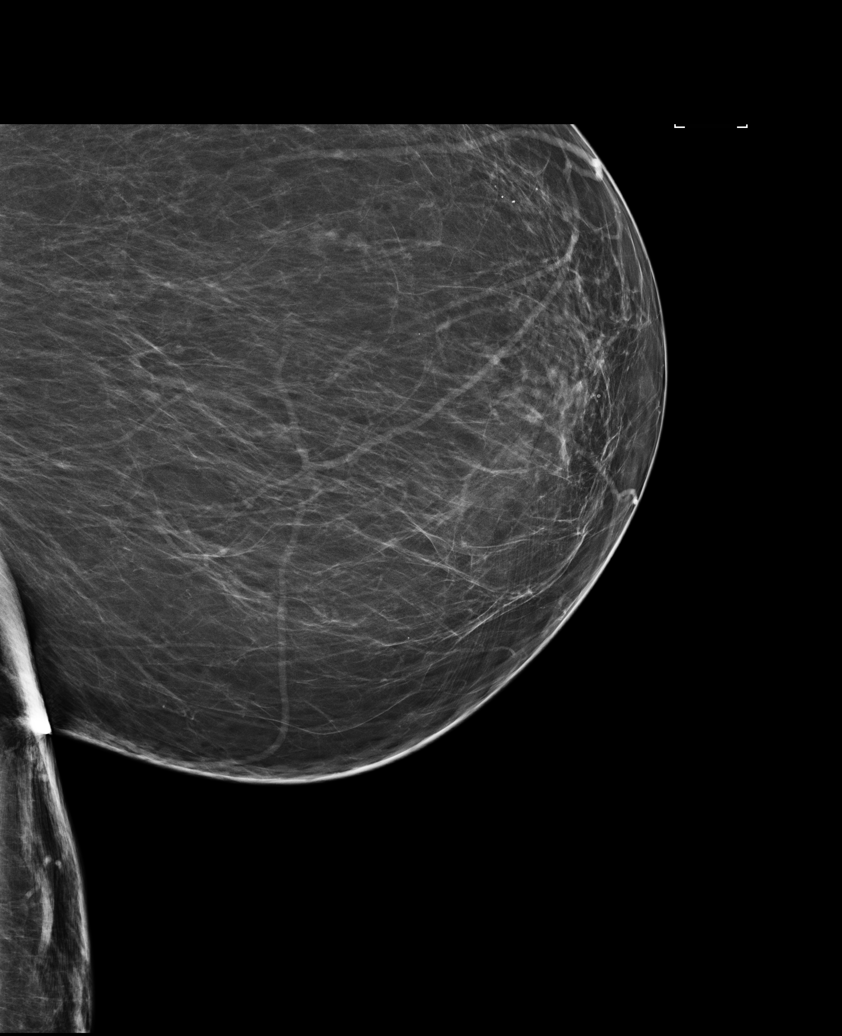

[L CC (1 of 2)]
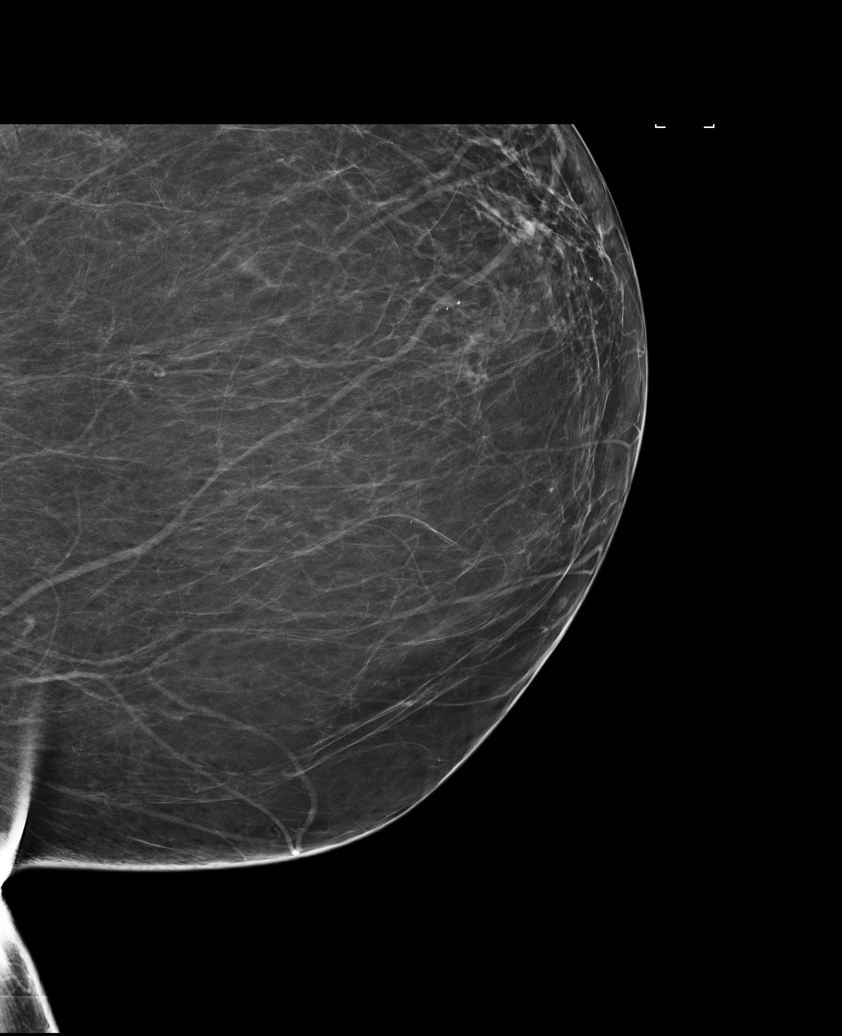

[R MLO]
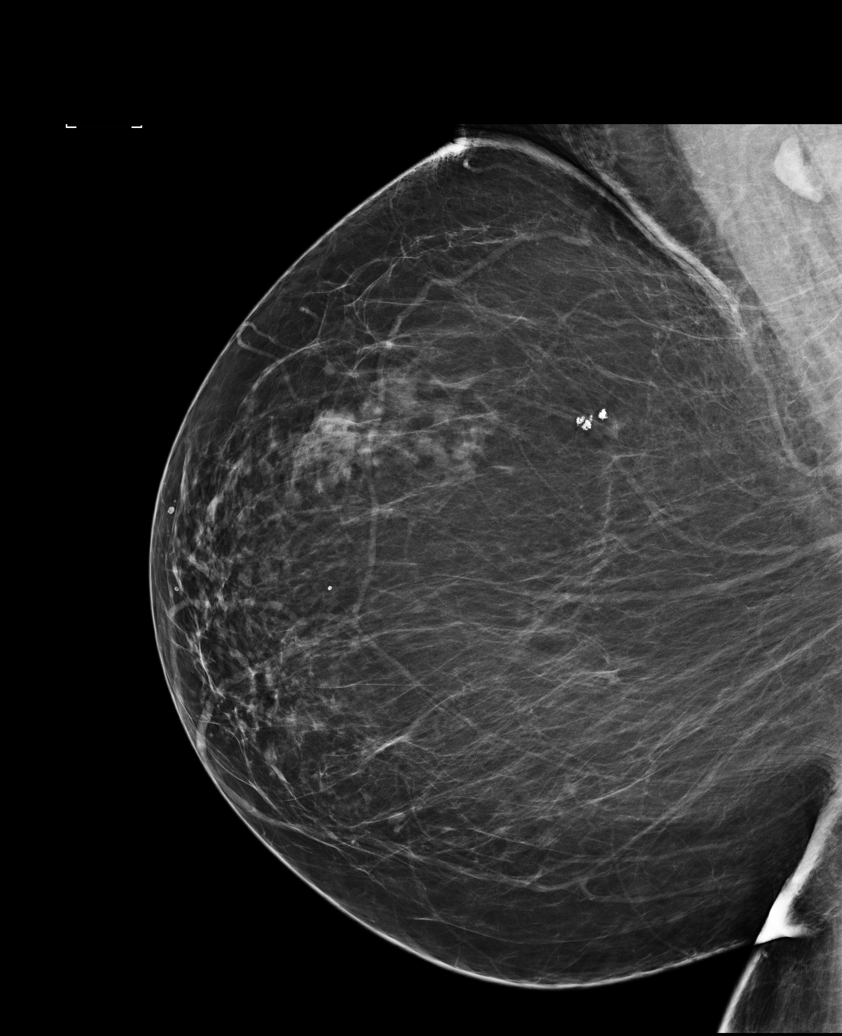

[L CC (2 of 2)]
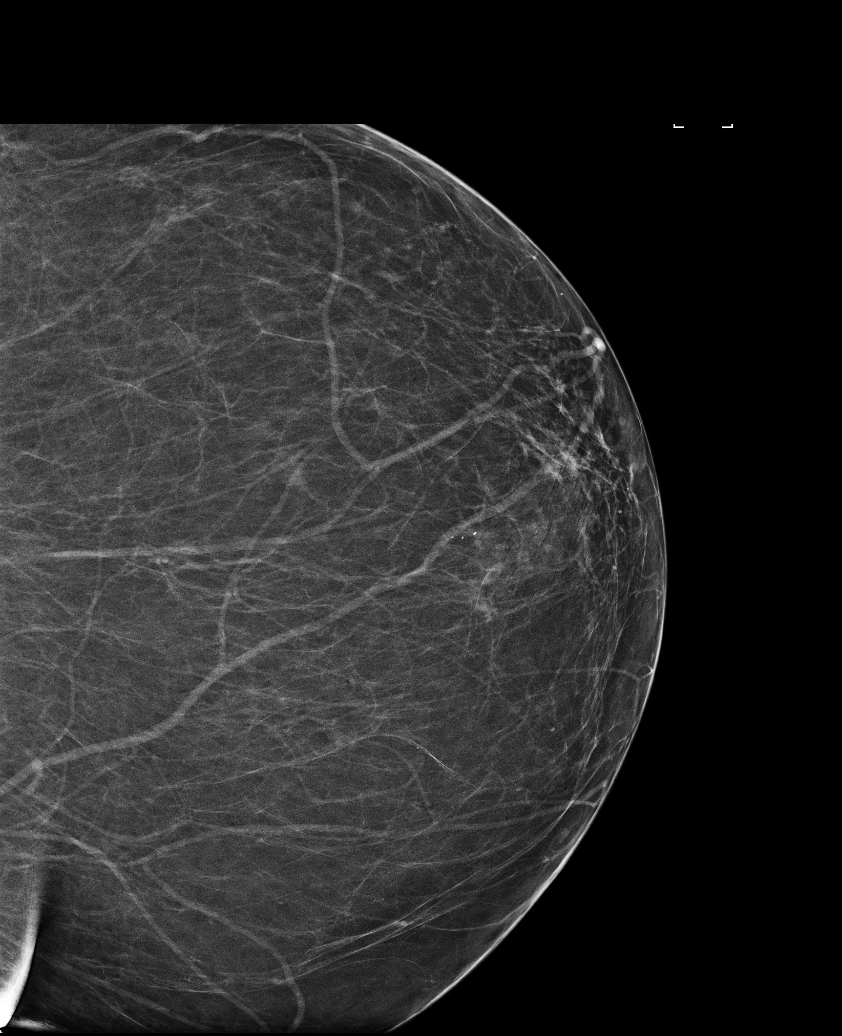

[L MLO (2 of 2)]
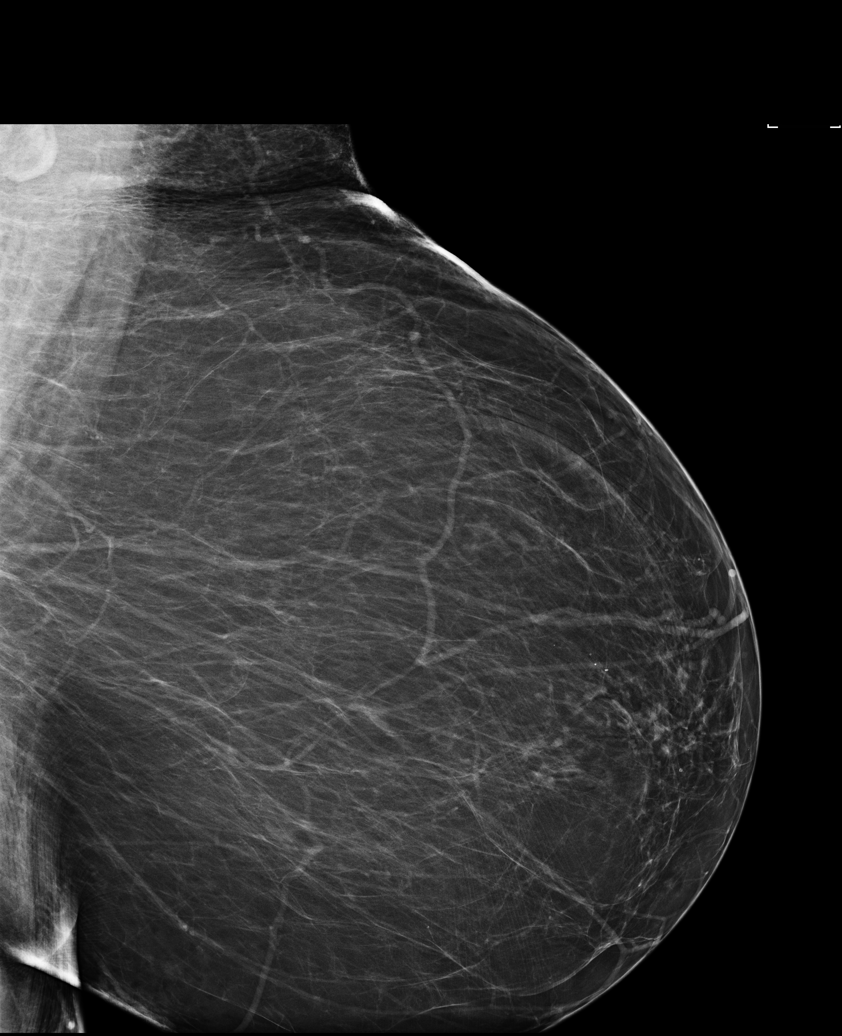

[R CC]
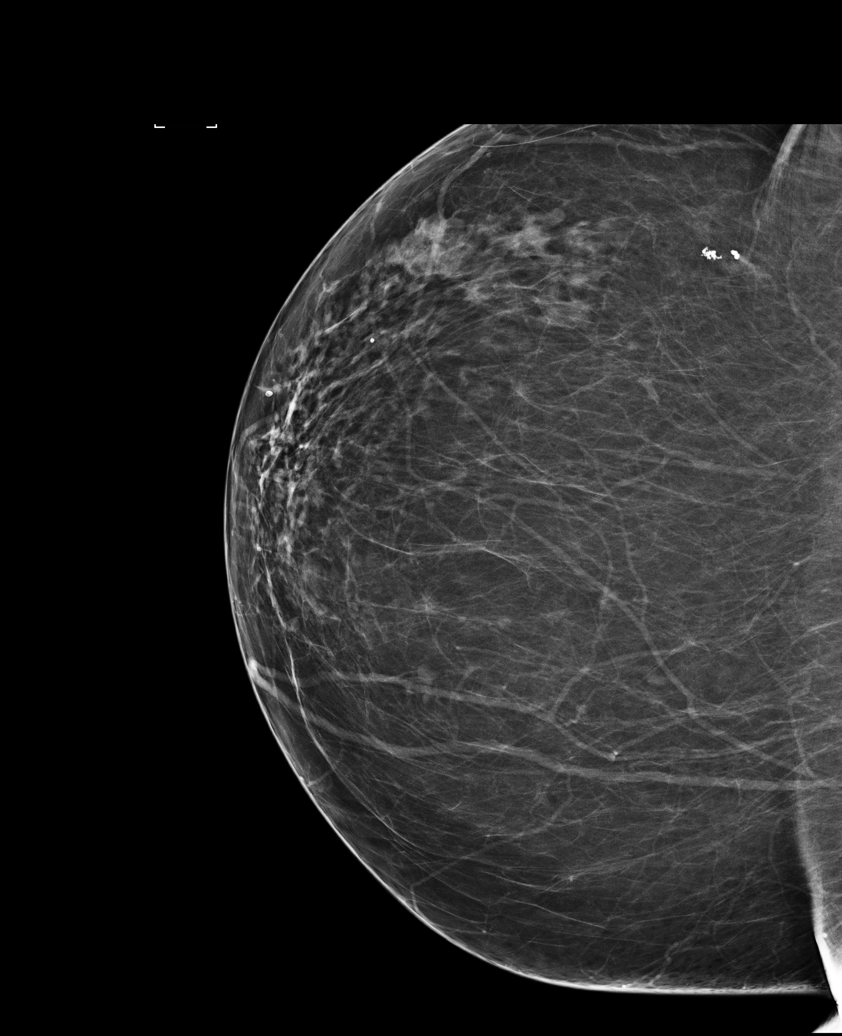

[6 of 26 positions shown; findings below may reference images not displayed]

ACR Breast Density Category b: There are scattered areas of
fibroglandular density.
FINDINGS: There are no findings suspicious for malignancy. Images were
processed with CAD.
IMPRESSION: No mammographic evidence of malignancy. A result letter of this
screening mammogram will be mailed directly to the patient.

RECOMMENDATION:
Screening mammogram in one year. (Code:97-6-RS4)

BI-RADS CATEGORY  1: Negative.

## 2019-04-16 ENCOUNTER — Ambulatory Visit (INDEPENDENT_AMBULATORY_CARE_PROVIDER_SITE_OTHER): Payer: Medicare Other | Admitting: Family Medicine

## 2019-04-16 ENCOUNTER — Other Ambulatory Visit: Payer: Self-pay

## 2019-04-16 ENCOUNTER — Encounter: Payer: Self-pay | Admitting: Family Medicine

## 2019-04-16 VITALS — BP 130/70 | Ht 66.0 in | Wt 210.0 lb

## 2019-04-16 DIAGNOSIS — J3089 Other allergic rhinitis: Secondary | ICD-10-CM

## 2019-04-16 DIAGNOSIS — J309 Allergic rhinitis, unspecified: Secondary | ICD-10-CM

## 2019-04-16 DIAGNOSIS — I10 Essential (primary) hypertension: Secondary | ICD-10-CM | POA: Diagnosis not present

## 2019-04-16 DIAGNOSIS — E66811 Obesity, class 1: Secondary | ICD-10-CM

## 2019-04-16 DIAGNOSIS — E669 Obesity, unspecified: Secondary | ICD-10-CM | POA: Diagnosis not present

## 2019-04-16 DIAGNOSIS — R7303 Prediabetes: Secondary | ICD-10-CM | POA: Diagnosis not present

## 2019-04-16 DIAGNOSIS — Z6833 Body mass index (BMI) 33.0-33.9, adult: Secondary | ICD-10-CM

## 2019-04-16 NOTE — Progress Notes (Signed)
Virtual Visit via Telephone Note  I connected with Erika [name] on 04/16/19 at  9:40 AM EST by telephone and verified that I am speaking with the correct person using two identifiers.  Location: Patient: home Provider: office   I discussed the limitations, risks, security and privacy concerns of performing an evaluation and management service by telephone and the availability of in person appointments. I also discussed with the patient that there may be a patient responsible charge related to this service. The patient expressed understanding and agreed to proceed.   History of Present Illness: F/u chronic problems , update medication and preventive health Denies any new or bothersome concerns, doing well overall, tired of being home bound a but very willing to follow this through. Confirms the safety of metamucil Not exercising as  Much, but working on diet to control weight management Denies recent fever or chills. Denies sinus pressure, nasal congestion, ear pain or sore throat. Denies chest congestion, productive cough or wheezing. Denies chest pains, palpitations and leg swelling Denies abdominal pain, nausea, vomiting,diarrhea or constipation.   Denies dysuria, frequency, hesitancy or incontinence. Denies uncontrolled  joint pain, swelling and limitation in mobility. Denies headaches, seizures, numbness, or tingling. Denies depression, anxiety or insomnia. Denies skin break down or rash.       Observations/Objective: BP 130/70   Ht 5\' 6"  (1.676 m)   Wt 210 lb (95.3 kg)   BMI 33.89 kg/m  Good communication with no confusion and intact memory. Alert and oriented x 3 No signs of respiratory distress during speech    Assessment and Plan: Essential hypertension Controlled, no change in medication DASH diet and commitment to daily physical activity for a minimum of 30 minutes discussed and encouraged, as a part of hypertension management. The importance of attaining a  healthy weight is also discussed.  BP/Weight 04/16/2019 02/12/2019 12/27/2018 08/06/2018 04/03/2018 03/11/2018 Q000111Q  Systolic BP AB-123456789 - AB-[id number] AB-123456789 [id number] [id number]  Diastolic BP 70 - 70 70 64 64 60  Wt. (Lbs) 210 210 212 212 212 212 204.08  BMI 33.89 33.89 34.22 34.22 34.22 34.22 31.96       Allergic rhinitis Controlled, no change in medication   Obesity (BMI 30.0-34.9)  Patient re-educated about  the importance of commitment to a  minimum of 150 minutes of exercise per week as able.  The importance of healthy food choices with portion control discussed, as well as eating regularly and within a 12 hour window most days. The need to choose "clean , green" food 50 to 75% of the time is discussed, as well as to make water the primary drink and set a goal of 64 ounces water daily.    Weight /BMI 04/16/2019 02/12/2019 12/27/2018  WEIGHT 210 lb 210 lb 212 lb  HEIGHT 5\' 6"  5\' 6"  5\' 6"   BMI 33.89 kg/m2 33.89 kg/m2 34.22 kg/m2      Prediabetes Patient educated about the importance of limiting  Carbohydrate intake , the need to commit to daily physical activity for a minimum of 30 minutes , and to commit weight loss. The fact that changes in all these areas will reduce or eliminate all together the development of diabetes is stressed.   Diabetic Labs Latest Ref Rng & Units 12/17/2018 07/29/2018 03/27/2018 11/26/2017 07/27/2017  HbA1c <5.7 % of total Hgb 5.9(H) 6.3(H) 6.0(H) 5.8(H) 6.2(H)  Microalbumin 0.00 - 1.89 mg/dL - - - - -  Micro/Creat Ratio 0.0 - 30.0 mg/g - - - - -  Chol <200 mg/dL - - 138 - -  HDL >50 mg/dL - - 52 - -  Calc LDL mg/dL (calc) - - 69 - -  Triglycerides <150 mg/dL - - 89 - -  Creatinine 0.60 - 0.88 mg/dL 0.70 - 0.76 0.64 0.84   BP/Weight 04/16/2019 02/12/2019 12/27/2018 08/06/2018 04/03/2018 03/11/2018 Q000111Q  Systolic BP [id number] - [id number] [id number] [id number]  Diastolic BP 70 - 70 70 64 64 60  Wt. (Lbs) 210 210 212 212 212 212 204.08  BMI 33.89 33.89 34.22 34.22 34.22 34.22 31.96    Foot/eye exam completion dates 03/16/2010 02/11/2010  Eye Exam - normal  Foot exam Order yes -  Foot Form Completion - -   Updated lab needed at/ before next visit.      Follow Up Instructions:    I discussed the assessment and treatment plan with the patient. The patient was provided an opportunity to ask questions and all were answered. The patient agreed with the plan and demonstrated an understanding of the instructions.   The patient was advised to call back or seek an in-person evaluation if the symptoms worsen or if the condition fails to improve as anticipated.  I provided 25 minutes of non-face-to-face time during this encounter.   Tula Nakayama, MD

## 2019-04-16 NOTE — Patient Instructions (Addendum)
Annual physical exam in office with MD end April, call if you need me before   Please get fasting lipid, cmp and EGFr, HBA1C, TSH, Vit D, CBC 1 week before next visit  HAPPY BIRTHDAY and many, many more  It is important that you exercise regularly at least 30 minutes 5 times a week. If you develop chest pain, have severe difficulty breathing, or feel very tired, stop exercising immediately and seek medical attention  Think about what you will eat, plan ahead. Choose " clean, green, fresh or frozen" over canned, processed or packaged foods which are more sugary, salty and fatty. 70 to 75% of food eaten should be vegetables and fruit. Three meals at set times with snacks allowed between meals, but they must be fruit or vegetables. Aim to eat over a 12 hour period , example 7 am to 7 pm, and STOP after  your last meal of the day. Drink water,generally about 64 ounces per day, no other drink is as healthy. Fruit juice is best enjoyed in a healthy way, by EATING the fruit. Thanks for choosing [facility], we consider it a privelige to serve you.

## 2019-04-20 ENCOUNTER — Encounter: Payer: Self-pay | Admitting: Family Medicine

## 2019-04-20 NOTE — Assessment & Plan Note (Signed)
  Patient re-educated about  the importance of commitment to a  minimum of 150 minutes of exercise per week as able.  The importance of healthy food choices with portion control discussed, as well as eating regularly and within a 12 hour window most days. The need to choose "clean , green" food 50 to 75% of the time is discussed, as well as to make water the primary drink and set a goal of 64 ounces water daily.    Weight /BMI 04/16/2019 02/12/2019 12/27/2018  WEIGHT 210 lb 210 lb 212 lb  HEIGHT 5\' 6"  5\' 6"  5\' 6"   BMI 33.89 kg/m2 33.89 kg/m2 34.22 kg/m2

## 2019-04-20 NOTE — Assessment & Plan Note (Signed)
Controlled, no change in medication  

## 2019-04-20 NOTE — Assessment & Plan Note (Signed)
Controlled, no change in medication DASH diet and commitment to daily physical activity for a minimum of 30 minutes discussed and encouraged, as a part of hypertension management. The importance of attaining a healthy weight is also discussed.  BP/Weight 04/16/2019 02/12/2019 12/27/2018 08/06/2018 04/03/2018 03/11/2018 Q000111Q  Systolic BP AB-123456789 - AB-123456789 [id number] [id number] [id number]  Diastolic BP 70 - 70 70 64 64 60  Wt. (Lbs) 210 210 212 212 212 212 204.08  BMI 33.89 33.89 34.22 34.22 34.22 34.22 31.96

## 2019-04-20 NOTE — Assessment & Plan Note (Signed)
Patient educated about the importance of limiting  Carbohydrate intake , the need to commit to daily physical activity for a minimum of 30 minutes , and to commit weight loss. The fact that changes in all these areas will reduce or eliminate all together the development of diabetes is stressed.   Diabetic Labs Latest Ref Rng & Units 12/17/2018 07/29/2018 03/27/2018 11/26/2017 07/27/2017  HbA1c <5.7 % of total Hgb 5.9(H) 6.3(H) 6.0(H) 5.8(H) 6.2(H)  Microalbumin 0.00 - 1.89 mg/dL - - - - -  Micro/Creat Ratio 0.0 - 30.0 mg/g - - - - -  Chol <200 mg/dL - - 138 - -  HDL >50 mg/dL - - 52 - -  Calc LDL mg/dL (calc) - - 69 - -  Triglycerides <150 mg/dL - - 89 - -  Creatinine 0.60 - 0.88 mg/dL 0.70 - 0.76 0.64 0.84   BP/Weight 04/16/2019 02/12/2019 12/27/2018 08/06/2018 04/03/2018 03/11/2018 [id number]  Systolic BP AB-123456789 - AB-123456789 AB-123456789 [id number]  Diastolic BP 70 - 70 70 64 64 60  Wt. (Lbs) 210 210 212 212 212 212 204.08  BMI 33.89 33.89 34.22 34.22 34.22 34.22 31.96   Foot/eye exam completion dates 03/16/2010 02/11/2010  Eye Exam - normal  Foot exam Order yes -  Foot Form Completion - -   Updated lab needed at/ before next visit.

## 2019-07-10 ENCOUNTER — Other Ambulatory Visit: Payer: Self-pay | Admitting: Family Medicine

## 2019-08-25 ENCOUNTER — Encounter: Payer: Medicare Other | Admitting: Family Medicine

## 2019-11-20 ENCOUNTER — Other Ambulatory Visit: Payer: Self-pay | Admitting: *Deleted

## 2019-11-20 MED ORDER — LOSARTAN POTASSIUM-HCTZ 50-12.5 MG PO TABS: 1.0000 | ORAL_TABLET | Freq: Every day | ORAL | 1 refills | Status: DC

## 2019-12-08 ENCOUNTER — Other Ambulatory Visit (HOSPITAL_COMMUNITY): Payer: Self-pay | Admitting: Internal Medicine

## 2019-12-08 DIAGNOSIS — Z1231 Encounter for screening mammogram for malignant neoplasm of breast: Secondary | ICD-10-CM

## 2019-12-18 ENCOUNTER — Encounter: Payer: Medicare Other | Admitting: Family Medicine

## 2019-12-24 ENCOUNTER — Ambulatory Visit (HOSPITAL_COMMUNITY): Payer: Medicare Other

## 2019-12-30 ENCOUNTER — Encounter: Payer: Medicare Other | Admitting: Family Medicine

## 2019-12-31 ENCOUNTER — Other Ambulatory Visit: Payer: Self-pay

## 2019-12-31 ENCOUNTER — Ambulatory Visit (HOSPITAL_COMMUNITY)
Admission: RE | Admit: 2019-12-31 | Discharge: 2019-12-31 | Disposition: A | Discharge status: Home / Self Care | Payer: Medicare Other | Source: Ambulatory Visit | Attending: Internal Medicine | Admitting: Internal Medicine

## 2019-12-31 DIAGNOSIS — Z1231 Encounter for screening mammogram for malignant neoplasm of breast: Secondary | ICD-10-CM | POA: Diagnosis not present

## 2020-06-11 ENCOUNTER — Other Ambulatory Visit: Payer: Self-pay | Admitting: Family Medicine

## 2020-09-21 DIAGNOSIS — E785 Hyperlipidemia, unspecified: Secondary | ICD-10-CM | POA: Insufficient documentation

## 2020-09-21 DIAGNOSIS — D696 Thrombocytopenia, unspecified: Secondary | ICD-10-CM | POA: Insufficient documentation

## 2020-12-06 ENCOUNTER — Other Ambulatory Visit (HOSPITAL_COMMUNITY): Payer: Self-pay | Admitting: Internal Medicine

## 2020-12-06 DIAGNOSIS — Z1231 Encounter for screening mammogram for malignant neoplasm of breast: Secondary | ICD-10-CM

## 2021-01-05 ENCOUNTER — Ambulatory Visit (HOSPITAL_COMMUNITY): Payer: Medicare Other

## 2021-01-12 ENCOUNTER — Ambulatory Visit (HOSPITAL_COMMUNITY)
Admission: RE | Admit: 2021-01-12 | Discharge: 2021-01-12 | Disposition: A | Discharge status: Home / Self Care | Payer: Medicare Other | Source: Ambulatory Visit | Attending: Internal Medicine | Admitting: Internal Medicine

## 2021-01-12 ENCOUNTER — Other Ambulatory Visit: Payer: Self-pay

## 2021-01-12 DIAGNOSIS — Z1231 Encounter for screening mammogram for malignant neoplasm of breast: Secondary | ICD-10-CM | POA: Diagnosis not present

## 2024-05-08 ENCOUNTER — Ambulatory Visit: Admitting: Orthopedic Surgery

## 2024-05-08 ENCOUNTER — Encounter: Payer: Self-pay | Admitting: Orthopedic Surgery

## 2024-05-08 ENCOUNTER — Other Ambulatory Visit: Payer: Self-pay

## 2024-05-08 VITALS — BP 149/77 | HR 79 | Ht 66.0 in | Wt 200.0 lb

## 2024-05-08 DIAGNOSIS — M5410 Radiculopathy, site unspecified: Secondary | ICD-10-CM

## 2024-05-08 DIAGNOSIS — M5430 Sciatica, unspecified side: Secondary | ICD-10-CM | POA: Insufficient documentation

## 2024-05-08 NOTE — Patient Instructions (Addendum)
 Use a heating pad 15 min at a time 2-3 x a day   Apply   You can use topical medication such as Bengay, Aspercreme, Biofreeze, Voltaren gel  You can take tylenol or ibuprofen  as needed

## 2024-05-08 NOTE — Progress Notes (Signed)
" ° °  Patient: Erika [name]           Date of Birth: 1936-02-27           MRN: [id number] Visit Date: 05/08/2024 Requested by: Shona Norleen PEDLAR, MD [address] [place],  [place] [address] PCP: Shona Norleen PEDLAR, MD  Encounter Diagnosis  Name Primary?   Radicular pain of left lower extremity Yes    Assessment and plan:  89 year old female with radiculopathy of the left leg seems to come and go.  She has some residual soreness behind her knee she can treat that with topical medication and use Tylenol arthritis or ibuprofen   No follow-up needed     Chief Complaint  Patient presents with   Knee Pain    Left    Leg Pain    Left leg feels heavy and sore     History:  89 year old female presents with history of pain behind her left knee.  It started with pain in the left hip which radiating down the leg and posterior to the knee into the lower leg.  She has no pain now she has soreness behind the knee in the popliteal fossa.  She also says the leg feels heavy.  Focused exam findings:  Left knee:   Range of motion of the knee seems fine, she is tender in the popliteal fossa there is no effusion Knee is stable    DG Lumbar Spine 2-3 Views Result Date: 05/08/2024 Images of the spine patient complains of pain behind the left knee started with radicular-like symptoms now just soreness X-rays show extensive degenerative disc disease in all levels with some level showing obliteration of the disc spaces there is also a spondylolisthesis of L5 on S1 Impression extensive spondylosis at the full spine with grade 1 spondylolisthesis of L5-S1      "

## 2024-05-08 NOTE — Progress Notes (Signed)
" °  Intake history:  Chief Complaint  Patient presents with   Knee Pain    Left      BP (!) 149/77   Pulse 79   Ht 5' 6 (1.676 m)   Wt 200 lb (90.7 kg)   BMI 32.28 kg/m  Body mass index is 32.28 kg/m.  Pharmacy? ______[facility] 14________________________________  WHAT ARE WE SEEING YOU FOR TODAY?   Back of left knee  How long has this bothered you? (DOI?DOS?WS?)  Since first of September 2025  Was there an injury? No but thinks it may have started due to using exercise bike   Anticoag.  No   Any ALLERGIES _______Allergies[1] _______________________________________   Treatment:  Have you taken:  Tylenol Yes  Advil  No  Had PT No  Had injection No  Other  _________________________        [1]  Allergies Allergen Reactions   Poison Oak Extract Dermatitis   Benazepril-Hydrochlorothiazide      REACTION: cough   Gabapentin Itching   Restoril  [Temazepam ] Other (See Comments)    Woke with headache   "
